# Patient Record
Sex: Female | Born: 1955 | Race: White | Hispanic: No | Smoking: Never smoker
Health system: Southern US, Community
[De-identification: ages and names within clinical notes are randomized; demographics above are authoritative.]

## PROBLEM LIST (undated history)

## (undated) DIAGNOSIS — C4491 Basal cell carcinoma of skin, unspecified: Secondary | ICD-10-CM

## (undated) HISTORY — DX: Basal cell carcinoma of skin, unspecified: C44.91

## (undated) HISTORY — PX: BASAL CELL CARCINOMA EXCISION: SHX1214

## (undated) HISTORY — PX: OTHER SURGICAL HISTORY: SHX169

---

## 1983-08-08 HISTORY — PX: BACK SURGERY: SHX140

## 2000-08-02 ENCOUNTER — Encounter: Payer: Self-pay | Admitting: Family Medicine

## 2000-08-02 ENCOUNTER — Encounter: Admission: RE | Admit: 2000-08-02 | Discharge: 2000-08-02 | Payer: Self-pay | Admitting: Family Medicine

## 2001-02-25 ENCOUNTER — Other Ambulatory Visit: Admission: RE | Admit: 2001-02-25 | Discharge: 2001-02-25 | Payer: Self-pay | Admitting: Family Medicine

## 2003-09-01 ENCOUNTER — Emergency Department (HOSPITAL_COMMUNITY): Admission: EM | Admit: 2003-09-01 | Discharge: 2003-09-01 | Payer: Self-pay | Admitting: Emergency Medicine

## 2004-09-20 ENCOUNTER — Emergency Department (HOSPITAL_COMMUNITY): Admission: EM | Admit: 2004-09-20 | Discharge: 2004-09-20 | Payer: Self-pay | Admitting: Emergency Medicine

## 2005-09-21 ENCOUNTER — Encounter: Admission: RE | Admit: 2005-09-21 | Discharge: 2005-09-21 | Payer: Self-pay | Admitting: Family Medicine

## 2005-12-05 LAB — HM COLONOSCOPY

## 2006-10-02 ENCOUNTER — Encounter: Admission: RE | Admit: 2006-10-02 | Discharge: 2006-10-02 | Payer: Self-pay | Admitting: Family Medicine

## 2007-10-17 ENCOUNTER — Encounter: Admission: RE | Admit: 2007-10-17 | Discharge: 2007-10-17 | Payer: Self-pay | Admitting: Family Medicine

## 2008-10-20 ENCOUNTER — Encounter: Admission: RE | Admit: 2008-10-20 | Discharge: 2008-10-20 | Payer: Self-pay | Admitting: Family Medicine

## 2009-10-05 LAB — HM MAMMOGRAPHY

## 2009-11-01 ENCOUNTER — Encounter: Admission: RE | Admit: 2009-11-01 | Discharge: 2009-11-01 | Payer: Self-pay | Admitting: Family Medicine

## 2010-12-13 ENCOUNTER — Ambulatory Visit: Payer: Self-pay | Admitting: Family Medicine

## 2011-01-06 ENCOUNTER — Other Ambulatory Visit (HOSPITAL_COMMUNITY)
Admission: RE | Admit: 2011-01-06 | Discharge: 2011-01-06 | Disposition: A | Discharge status: Home / Self Care | Payer: PRIVATE HEALTH INSURANCE | Source: Ambulatory Visit | Attending: Family Medicine | Admitting: Family Medicine

## 2011-01-06 ENCOUNTER — Encounter: Payer: Self-pay | Admitting: Family Medicine

## 2011-01-06 ENCOUNTER — Ambulatory Visit (INDEPENDENT_AMBULATORY_CARE_PROVIDER_SITE_OTHER): Payer: PRIVATE HEALTH INSURANCE | Admitting: Family Medicine

## 2011-01-06 DIAGNOSIS — Z13220 Encounter for screening for lipoid disorders: Secondary | ICD-10-CM

## 2011-01-06 DIAGNOSIS — Z1159 Encounter for screening for other viral diseases: Secondary | ICD-10-CM | POA: Insufficient documentation

## 2011-01-06 DIAGNOSIS — Z23 Encounter for immunization: Secondary | ICD-10-CM

## 2011-01-06 DIAGNOSIS — Z8349 Family history of other endocrine, nutritional and metabolic diseases: Secondary | ICD-10-CM

## 2011-01-06 DIAGNOSIS — J309 Allergic rhinitis, unspecified: Secondary | ICD-10-CM | POA: Insufficient documentation

## 2011-01-06 DIAGNOSIS — Z0000 Encounter for general adult medical examination without abnormal findings: Secondary | ICD-10-CM

## 2011-01-06 DIAGNOSIS — Z1231 Encounter for screening mammogram for malignant neoplasm of breast: Secondary | ICD-10-CM

## 2011-01-06 DIAGNOSIS — M810 Age-related osteoporosis without current pathological fracture: Secondary | ICD-10-CM

## 2011-01-06 DIAGNOSIS — Z01419 Encounter for gynecological examination (general) (routine) without abnormal findings: Secondary | ICD-10-CM

## 2011-01-06 NOTE — Progress Notes (Signed)
  Subjective:    Patient ID: Christy Gallegos, female    DOB: 1955-11-07, 55 y.o.   MRN: [id number]  HPI New pt here to establish.  Previously saw Dr. Raquel James. Last saw her over a year ago.  She is overdue for CPX.    Review of Systems  Constitutional: Negative for fever, fatigue and unexpected weight change.  HENT: Negative for congestion and sneezing.   Gastrointestinal: Negative for abdominal pain, diarrhea, constipation, blood in stool and abdominal distention.  Genitourinary: Negative for dysuria, hematuria and enuresis.  Musculoskeletal: Negative for arthralgias and gait problem.  Neurological: Negative for syncope, weakness and numbness.       HAs residual numbness in right 4 and 5th toes from previous back surgery  Psychiatric/Behavioral: Negative for behavioral problems, dysphoric mood and agitation.       Objective:   Physical Exam  Constitutional: Vital signs are normal. She appears well-developed and well-nourished. She is cooperative.  Non-toxic appearance. She does not appear ill. No distress.  HENT:  Head: Normocephalic.  Right Ear: Hearing, tympanic membrane, external ear and ear canal normal.  Left Ear: Hearing, tympanic membrane, external ear and ear canal normal.  Nose: Nose normal.  Eyes: Conjunctivae, EOM and lids are normal. Pupils are equal, round, and reactive to light. No foreign bodies found.  Neck: Trachea normal and normal range of motion. Neck supple. Carotid bruit is not present. No mass and no thyromegaly present.  Cardiovascular: Normal rate, regular rhythm, S1 normal, S2 normal, normal heart sounds and intact distal pulses.  Exam reveals no gallop.   No murmur heard. Pulmonary/Chest: Effort normal and breath sounds normal. No respiratory distress. She has no wheezes. She has no rhonchi. She has no rales.  Abdominal: Soft. Normal appearance and bowel sounds are normal. She exhibits no distension, no fluid wave, no abdominal bruit and no mass. There  is no hepatosplenomegaly. There is no tenderness. There is no rebound, no guarding and no CVA tenderness. No hernia.  Genitourinary: Rectum normal, vagina normal and uterus normal. No breast swelling, tenderness, discharge or bleeding. Pelvic exam was performed with patient prone. There is no rash, tenderness or lesion on the right labia. There is no rash, tenderness or lesion on the left labia. Uterus is not enlarged and not tender. Cervix exhibits no motion tenderness, no discharge and no friability. Right adnexum displays no mass, no tenderness and no fullness. Left adnexum displays no mass, no tenderness and no fullness.  Lymphadenopathy:    She has no cervical adenopathy.    She has no axillary adenopathy.  Neurological: She is alert. She has normal strength. No cranial nerve deficit or sensory deficit.  Skin: Skin is warm, dry and intact. No rash noted.  Psychiatric: Her speech is normal and behavior is normal. Judgment normal. Her mood appears not anxious. Cognition and memory are normal. She does not exhibit a depressed mood.          Assessment & Plan:  Complete pHysical Exam : The patient's preventative maintenance and recommended screening tests for an annual wellness exam were reviewed in full today. Brought up to date unless services declined.  Counselled on the importance of diet, exercise, and its role in overall health and mortality. The patient's FH and SH was reviewed, including their home life, tobacco status, and drug and alcohol status.

## 2011-01-06 NOTE — Assessment & Plan Note (Signed)
On calcium, vit D. Minimal weight bearing exercsie.  Tried fosamax and actonel.. Caused jaw pain and GI distress. Due for reeval.

## 2011-01-06 NOTE — Patient Instructions (Addendum)
[facility] to schedule [facility] when you are ready.  Due for repeat DEXA...schedule at your office and fax results to [place]. Work on increasing exercise and continue healthy diet.

## 2011-01-13 ENCOUNTER — Encounter: Payer: Self-pay | Admitting: *Deleted

## 2011-01-13 LAB — HM DEXA SCAN

## 2011-01-17 ENCOUNTER — Encounter: Payer: Self-pay | Admitting: Family Medicine

## 2011-01-23 ENCOUNTER — Ambulatory Visit
Admission: RE | Admit: 2011-01-23 | Discharge: 2011-01-23 | Disposition: A | Discharge status: Home / Self Care | Payer: PRIVATE HEALTH INSURANCE | Source: Ambulatory Visit | Attending: Family Medicine | Admitting: Family Medicine

## 2011-01-23 DIAGNOSIS — Z1231 Encounter for screening mammogram for malignant neoplasm of breast: Secondary | ICD-10-CM

## 2011-01-25 ENCOUNTER — Encounter: Payer: Self-pay | Admitting: Family Medicine

## 2011-01-25 ENCOUNTER — Encounter: Payer: Self-pay | Admitting: *Deleted

## 2011-01-29 ENCOUNTER — Encounter: Payer: Self-pay | Admitting: Family Medicine

## 2011-02-14 ENCOUNTER — Other Ambulatory Visit: Payer: Self-pay | Admitting: Family Medicine

## 2011-02-14 LAB — LIPID PANEL
Cholesterol: 223 mg/dL — ABNORMAL HIGH (ref 0–200)
Triglycerides: 55 mg/dL (ref <150)

## 2011-02-14 LAB — COMPREHENSIVE METABOLIC PANEL
ALT: 26 U/L (ref 0–35)
AST: 27 U/L (ref 0–37)
BUN: 13 mg/dL (ref 6–23)
Creat: 0.77 mg/dL (ref 0.50–1.10)

## 2011-10-11 ENCOUNTER — Other Ambulatory Visit: Payer: Self-pay | Admitting: Family Medicine

## 2011-10-11 DIAGNOSIS — Z1231 Encounter for screening mammogram for malignant neoplasm of breast: Secondary | ICD-10-CM

## 2011-12-19 ENCOUNTER — Telehealth: Payer: Self-pay

## 2011-12-19 NOTE — Telephone Encounter (Signed)
Prescription to fax in outbox.

## 2011-12-19 NOTE — Telephone Encounter (Signed)
RX FAXED TO FAXED AS REQUESTED

## 2011-12-19 NOTE — Telephone Encounter (Signed)
Pt already scheduled CPX 01/19/12 with Dr Ermalene Searing and pt request faxed lab order prior to CPX to [phone]. Pt can be reached at [phone] ext [phone].

## 2012-01-12 ENCOUNTER — Other Ambulatory Visit: Payer: Self-pay | Admitting: Family Medicine

## 2012-01-12 LAB — LIPID PANEL
Cholesterol: 237 mg/dL — ABNORMAL HIGH (ref 0–200)
HDL: 64 mg/dL (ref >39)
Total CHOL/HDL Ratio: 3.7 Ratio
VLDL: 11 mg/dL (ref 0–40)

## 2012-01-12 LAB — COMPREHENSIVE METABOLIC PANEL
AST: 26 U/L (ref 0–37)
Albumin: 4.5 g/dL (ref 3.5–5.2)
CO2: 29 mEq/L (ref 19–32)
Calcium: 9.8 mg/dL (ref 8.4–10.5)
Chloride: 103 mEq/L (ref 96–112)
Creat: 0.76 mg/dL (ref 0.50–1.10)
Potassium: 4.7 mEq/L (ref 3.5–5.3)
Sodium: 141 mEq/L (ref 135–145)
Total Bilirubin: 0.7 mg/dL (ref 0.3–1.2)
Total Protein: 6.9 g/dL (ref 6.0–8.3)

## 2012-01-15 ENCOUNTER — Encounter: Payer: PRIVATE HEALTH INSURANCE | Admitting: Family Medicine

## 2012-01-18 ENCOUNTER — Encounter: Payer: PRIVATE HEALTH INSURANCE | Admitting: Family Medicine

## 2012-01-19 ENCOUNTER — Encounter: Payer: Self-pay | Admitting: Family Medicine

## 2012-01-19 ENCOUNTER — Ambulatory Visit (INDEPENDENT_AMBULATORY_CARE_PROVIDER_SITE_OTHER): Payer: PRIVATE HEALTH INSURANCE | Admitting: Family Medicine

## 2012-01-19 VITALS — BP 100/70 | HR 81 | Temp 98.5°F | Ht 61.5 in | Wt 118.5 lb

## 2012-01-19 DIAGNOSIS — M810 Age-related osteoporosis without current pathological fracture: Secondary | ICD-10-CM

## 2012-01-19 DIAGNOSIS — Z01419 Encounter for gynecological examination (general) (routine) without abnormal findings: Secondary | ICD-10-CM

## 2012-01-19 DIAGNOSIS — E7800 Pure hypercholesterolemia, unspecified: Secondary | ICD-10-CM | POA: Insufficient documentation

## 2012-01-19 DIAGNOSIS — Z0000 Encounter for general adult medical examination without abnormal findings: Secondary | ICD-10-CM

## 2012-01-19 NOTE — Progress Notes (Signed)
Subjective:    Patient ID: Christy Gallegos, female    DOB: [birth year], 56 y.o.   MRN: [id number]  HPI The patient is here for annual wellness exam and preventative care.    Doing well overall. No acute concerns.  Walks daily. Good diet.  Reviewed labs in detail.    Review of Systems  Constitutional: Negative for fever, fatigue and unexpected weight change.  HENT: Negative for ear pain, congestion, sore throat, sneezing, trouble swallowing and sinus pressure.   Eyes: Negative for pain and itching.  Respiratory: Negative for cough, shortness of breath and wheezing.   Cardiovascular: Negative for chest pain, palpitations and leg swelling.  Gastrointestinal: Negative for nausea, abdominal pain, diarrhea, constipation and blood in stool.  Genitourinary: Negative for dysuria, hematuria, vaginal discharge, difficulty urinating and menstrual problem.  Musculoskeletal: Positive for back pain.  Skin: Negative for rash.  Neurological: Negative for syncope, weakness, light-headedness, numbness and headaches.  Psychiatric/Behavioral: Negative for confusion and dysphoric mood. The patient is not nervous/anxious.        Objective:   Physical Exam  Constitutional: Vital signs are normal. She appears well-developed and well-nourished. She is cooperative.  Non-toxic appearance. She does not appear ill. No distress.  HENT:  Head: Normocephalic.  Right Ear: Hearing, tympanic membrane, external ear and ear canal normal.  Left Ear: Hearing, tympanic membrane, external ear and ear canal normal.  Nose: Nose normal.  Eyes: Conjunctivae, EOM and lids are normal. Pupils are equal, round, and reactive to light. No foreign bodies found.  Neck: Trachea normal and normal range of motion. Neck supple. Carotid bruit is not present. No mass and no thyromegaly present.  Cardiovascular: Normal rate, regular rhythm, S1 normal, S2 normal, normal heart sounds and intact distal pulses.  Exam reveals no gallop.   No  murmur heard. Pulmonary/Chest: Effort normal and breath sounds normal. No respiratory distress. She has no wheezes. She has no rhonchi. She has no rales.  Abdominal: Soft. Normal appearance and bowel sounds are normal. She exhibits no distension, no fluid wave, no abdominal bruit and no mass. There is no hepatosplenomegaly. There is no tenderness. There is no rebound, no guarding and no CVA tenderness. No hernia.  Genitourinary: Vagina normal and uterus normal. No breast swelling, tenderness, discharge or bleeding. Pelvic exam was performed with patient prone. There is no rash, tenderness or lesion on the right labia. There is no rash, tenderness or lesion on the left labia. Uterus is not enlarged and not tender. Cervix exhibits no motion tenderness, no discharge and no friability. Right adnexum displays no mass, no tenderness and no fullness. Left adnexum displays no mass, no tenderness and no fullness.  Lymphadenopathy:    She has no cervical adenopathy.    She has no axillary adenopathy.  Neurological: She is alert. She has normal strength. No cranial nerve deficit or sensory deficit.  Skin: Skin is warm, dry and intact. No rash noted.       0.2 cm lesion on right nostril, pearly raised, possibly getting bigger per pt... Recommended following and if increasing in size see Derm for biopsy  Psychiatric: Her speech is normal and behavior is normal. Judgment normal. Her mood appears not anxious. Cognition and memory are normal. She does not exhibit a depressed mood.          Assessment & Plan:  The patient's preventative maintenance and recommended screening tests for an annual wellness exam were reviewed in full today. Brought up to date unless services declined.  Counselled on the importance of diet, exercise, and its role in overall health and mortality. The patient's FH and SH was reviewed, including their home life, tobacco status, and drug and alcohol status.   DEXa: last slightly worse  osteopenia in 2012, next due 2014. Vaccines: Uptodate with Tdap.  Mammo: last nml 2012, has this scheduled. Asymptomatic.  DVE/pap: DVE yearly,  last pap done on 01/2011, on q3 year schedule, next due 2015. Colon: last done 12/05/2005, nml, repeat in 10 years 2017 Non Smoker

## 2012-01-19 NOTE — Assessment & Plan Note (Signed)
Not due for dexa this year.

## 2012-01-19 NOTE — Assessment & Plan Note (Signed)
Counsled on diet and lifestyle change. Will work on this ans recheck in 1 year.  Info sheet given.

## 2012-01-19 NOTE — Patient Instructions (Addendum)
Work on low cholesterol diet. Avoid eating out. Increase exercise. Review cholesterol information packet. Follow area on nose.. If increasing in size.. Call for Dermatology referral.

## 2012-01-29 ENCOUNTER — Ambulatory Visit
Admission: RE | Admit: 2012-01-29 | Discharge: 2012-01-29 | Disposition: A | Discharge status: Home / Self Care | Payer: PRIVATE HEALTH INSURANCE | Source: Ambulatory Visit | Attending: Family Medicine | Admitting: Family Medicine

## 2012-01-29 DIAGNOSIS — Z1231 Encounter for screening mammogram for malignant neoplasm of breast: Secondary | ICD-10-CM

## 2012-12-23 ENCOUNTER — Other Ambulatory Visit: Payer: Self-pay

## 2012-12-23 DIAGNOSIS — Z1231 Encounter for screening mammogram for malignant neoplasm of breast: Secondary | ICD-10-CM

## 2013-01-06 ENCOUNTER — Telehealth: Payer: Self-pay | Admitting: *Deleted

## 2013-01-06 NOTE — Telephone Encounter (Signed)
Patient would like lab order faxed to her work to have lab work done.   [phone](AVW)

## 2013-01-07 NOTE — Telephone Encounter (Signed)
Lab order for what... Upcoming CPX?

## 2013-01-07 NOTE — Telephone Encounter (Signed)
Yes cpx on June 17

## 2013-01-07 NOTE — Telephone Encounter (Signed)
Faxed to patient

## 2013-01-07 NOTE — Telephone Encounter (Signed)
In outbox

## 2013-01-21 ENCOUNTER — Encounter: Payer: PRIVATE HEALTH INSURANCE | Admitting: Family Medicine

## 2013-02-03 ENCOUNTER — Ambulatory Visit
Admission: RE | Admit: 2013-02-03 | Discharge: 2013-02-03 | Disposition: A | Discharge status: Home / Self Care | Payer: PRIVATE HEALTH INSURANCE | Source: Ambulatory Visit

## 2013-02-03 DIAGNOSIS — Z1231 Encounter for screening mammogram for malignant neoplasm of breast: Secondary | ICD-10-CM

## 2013-02-21 ENCOUNTER — Encounter: Payer: PRIVATE HEALTH INSURANCE | Admitting: Family Medicine

## 2013-03-04 ENCOUNTER — Encounter: Payer: PRIVATE HEALTH INSURANCE | Admitting: Family Medicine

## 2013-04-01 ENCOUNTER — Encounter: Payer: PRIVATE HEALTH INSURANCE | Admitting: Family Medicine

## 2013-09-18 ENCOUNTER — Other Ambulatory Visit: Payer: Self-pay | Admitting: Dermatology

## 2015-01-11 ENCOUNTER — Other Ambulatory Visit: Payer: Self-pay

## 2015-01-11 DIAGNOSIS — Z1231 Encounter for screening mammogram for malignant neoplasm of breast: Secondary | ICD-10-CM

## 2015-01-25 ENCOUNTER — Ambulatory Visit
Admission: RE | Admit: 2015-01-25 | Discharge: 2015-01-25 | Disposition: A | Discharge status: Home / Self Care | Payer: PRIVATE HEALTH INSURANCE | Source: Ambulatory Visit

## 2015-01-25 DIAGNOSIS — Z1231 Encounter for screening mammogram for malignant neoplasm of breast: Secondary | ICD-10-CM

## 2015-01-26 ENCOUNTER — Encounter: Payer: Self-pay | Admitting: Family Medicine

## 2015-01-26 LAB — HM MAMMOGRAPHY: HM Mammogram: NORMAL

## 2015-07-25 ENCOUNTER — Ambulatory Visit (INDEPENDENT_AMBULATORY_CARE_PROVIDER_SITE_OTHER): Payer: PRIVATE HEALTH INSURANCE | Admitting: Family Medicine

## 2015-07-25 VITALS — BP 118/72 | HR 90 | Temp 98.1°F | Resp 16 | Ht 61.5 in | Wt 124.0 lb

## 2015-07-25 DIAGNOSIS — H5711 Ocular pain, right eye: Secondary | ICD-10-CM

## 2015-07-25 DIAGNOSIS — H539 Unspecified visual disturbance: Secondary | ICD-10-CM | POA: Diagnosis not present

## 2015-07-25 NOTE — Progress Notes (Signed)
[facility] [address] [place] [address] [phone]  Date:  07/25/2015   Name:  Christy Gallegos   DOB:  12-Jan-1956   MRN:  [id number]  PCP:  Eliezer Lofts, MD    Chief Complaint: Eye Pain   History of Present Illness:  This is a 59 y.o. female  who is presenting right right eye redness and watery drainage x 3 days.  Very blurred vision out of that eye. + Photophobia. Sharp shooting pain in eyeball with EOM. No pain with blinking. Pain down into maxillary area and having right sided headache that is worse with bending forward. Headache started after eye symptoms started. Feeling congested right nostril. Taking tylenol sinus but not helping Has had 4 episodes in the past year of similar symptoms. Usually in right eye but has also been in left eye. Sees optometrist Macarthur Critchley who has told her it might be severe dry eye that is causing corneal abrasions. She does state one time she was given oral abx which helped. Current symptoms are worse than prior Denies fever or chills, sore throat, otalgia.  No hx autoimmune diseases. PCP: Eliezer Lofts  Review of Systems:  Review of Systems See HPI  Patient Active Problem List   Diagnosis Date Noted  . High cholesterol 01/19/2012  . Allergic rhinitis 01/06/2011  . Osteoporosis 01/06/2011    Prior to Admission medications   Medication Sig Start Date End Date Taking? Authorizing Provider  Black Cohosh (REMIFEMIN PO) Take by mouth.     Yes Historical Provider, MD  Calcium Carbonate-Vitamin D (CALTRATE 600+D) 600-400 MG-UNIT per tablet Take 1 tablet by mouth daily.     Yes Historical Provider, MD  fish oil-omega-3 fatty acids 1000 MG capsule Take 1 g by mouth 2 (two) times daily.     Yes Historical Provider, MD  L-Lysine 500 MG CAPS Take 1 capsule by mouth daily.     Yes Historical Provider, MD  loratadine (CLARITIN) 10 MG tablet Take 10 mg by mouth daily.     Yes Historical Provider, MD  Multiple  Vitamins-Minerals (CENTRUM SILVER) tablet Take 1 tablet by mouth daily.     Yes Historical Provider, MD  vitamin C (ASCORBIC ACID) 500 MG tablet Take 500 mg by mouth daily.     Yes Historical Provider, MD  vitamin E 400 UNIT capsule Take 400 Units by mouth daily.     Yes Historical Provider, MD    No Known Allergies  Past Surgical History  Procedure Laterality Date  . Back surgery  1985    L5-S1   . [place]sv[place]  [id number]    twins    Social History  Substance Use Topics  . Smoking status: Never Smoker   . Smokeless tobacco: Never Used  . Alcohol Use: No    Family History  Problem Relation Age of Onset  . Diabetes Mother   . Cancer Mother 75    bladder  . Arthritis Mother   . Neuropathy Mother   . Osteoporosis Mother   . Hypothyroidism Mother   . Osteoporosis Father   . Hyperlipidemia Father   . Stroke Maternal Grandmother   . Stroke Maternal Grandfather 60  . Heart disease Paternal Grandfather   . Arthritis Paternal Grandfather     Medication list has been reviewed and updated.  Physical Examination:  Physical Exam  Constitutional: She is oriented to person, place, and time. She appears well-developed and well-nourished. No distress.  HENT:  Head: Normocephalic  and atraumatic.  Right Ear: Hearing, tympanic membrane, external ear and ear canal normal.  Left Ear: Hearing, tympanic membrane, external ear and ear canal normal.  Nose: Right sinus exhibits maxillary sinus tenderness and frontal sinus tenderness. Left sinus exhibits no maxillary sinus tenderness and no frontal sinus tenderness.  Mouth/Throat: Uvula is midline, oropharynx is clear and moist and mucous membranes are normal.  Eyes: Pupils are equal, round, and reactive to light. Right eye exhibits discharge (watery). Left eye exhibits no discharge. Right conjunctiva is injected (mild). No scleral icterus.  Pain with EOM on right eye Cloudy circular area inferior to right pupil visible with naked eye. Same area with  uptake on woods lamp. No pyema seen Globe pain present  Cardiovascular: Normal rate, regular rhythm, normal heart sounds and normal pulses.   No murmur heard. Pulmonary/Chest: Effort normal and breath sounds normal. No respiratory distress. She has no wheezes. She has no rhonchi. She has no rales.  Musculoskeletal: Normal range of motion.  Lymphadenopathy:       Head (right side): No submental, no submandibular and no tonsillar adenopathy present.       Head (left side): No submental, no submandibular and no tonsillar adenopathy present.    She has no cervical adenopathy.  Neurological: She is alert and oriented to person, place, and time.  Skin: Skin is warm, dry and intact. No lesion and no rash noted.  Psychiatric: She has a normal mood and affect. Her speech is normal and behavior is normal. Thought content normal.   BP 118/72 mmHg  Pulse 90  Temp(Src) 98.1 F (36.7 C) (Oral)  Resp 16  Ht 5' 1.5" (1.562 m)  Wt 124 lb (56.246 kg)  BMI 23.05 kg/m2  SpO2 98%   Visual Acuity Screening   Right eye Left eye Both eyes  Without correction:  20/20 20/20  With correction:     Comments: unable to see out of right eye.  Assessment and Plan:  1. Eye pain, right 2. Vision changes Globe pain, severe vision change, pain with EOM, cloudy lesion inferior to right pupil -- all concerning for large corneal abrasion vs ulcer. Spoke with Dr. Wyatt Portela on the phone who agreed to see pt in his office today. Concern for possible autoimmune disease contributing to recurrent eye infections.   Benjaman Pott Drenda Freeze, MHS [facility]  07/25/2015

## 2015-07-25 NOTE — Patient Instructions (Signed)
Dr. Wyatt Portela [phone]

## 2015-07-25 NOTE — Progress Notes (Signed)
History and physical examinations obtained with PA Bush. Agree with assessment and plan as outlined.  Urgent referral to ophthalmology. Manuella Blackson Elayne Guerin, M.D. [facility] [address] [place], [place]  [address] [phone] phone [phone] fax

## 2015-12-22 ENCOUNTER — Other Ambulatory Visit: Payer: Self-pay

## 2015-12-22 DIAGNOSIS — Z1231 Encounter for screening mammogram for malignant neoplasm of breast: Secondary | ICD-10-CM

## 2016-02-14 ENCOUNTER — Ambulatory Visit: Payer: PRIVATE HEALTH INSURANCE

## 2016-02-28 ENCOUNTER — Ambulatory Visit
Admission: RE | Admit: 2016-02-28 | Discharge: 2016-02-28 | Disposition: A | Discharge status: Home / Self Care | Payer: PRIVATE HEALTH INSURANCE | Source: Ambulatory Visit

## 2016-02-28 DIAGNOSIS — Z1231 Encounter for screening mammogram for malignant neoplasm of breast: Secondary | ICD-10-CM

## 2017-01-27 ENCOUNTER — Ambulatory Visit (HOSPITAL_COMMUNITY)
Admission: EM | Admit: 2017-01-27 | Discharge: 2017-01-27 | Disposition: A | Discharge status: Home / Self Care | Payer: PRIVATE HEALTH INSURANCE

## 2017-01-27 ENCOUNTER — Encounter (HOSPITAL_COMMUNITY): Payer: Self-pay | Admitting: Emergency Medicine

## 2017-01-27 DIAGNOSIS — H0011 Chalazion right upper eyelid: Secondary | ICD-10-CM

## 2017-01-27 NOTE — ED Provider Notes (Signed)
CSN: [id number]     Arrival date & time 01/27/17  1453 History   First MD Initiated Contact with Patient 01/27/17 1553     Chief Complaint  Patient presents with  . Stye   (Consider location/radiation/quality/duration/timing/severity/associated sxs/prior Treatment) Patient came in today complaining of a bump to her right upper eyelid x 3 days, getting bigger and large. The area is slightly tender to palpate but no pain. She also denies visual disturbances, no discharge, no eye pain, no eye redness. Some itchiness is present. She have been self-treating with warm compress for the past 3 days with no improvement. She has an ophthalmologist. She have had a situation in the past where her "duct clogged up" and her ophthalmologist had to surgically removed it.        History reviewed. No pertinent past medical history. Past Surgical History:  Procedure Laterality Date  . [place]   L5-S1   . NSVD  [id number]   twins   Family History  Problem Relation Age of Onset  . Diabetes Mother   . Cancer Mother 3       bladder  . Arthritis Mother   . Neuropathy Mother   . Osteoporosis Mother   . Hypothyroidism Mother   . Osteoporosis Father   . Hyperlipidemia Father   . Stroke Maternal Grandmother   . Stroke Maternal Grandfather 60  . Heart disease Paternal Grandfather   . Arthritis Paternal Grandfather    Social History  Substance Use Topics  . Smoking status: Never Smoker  . Smokeless tobacco: Never Used  . Alcohol use No   OB History    No data available     Review of Systems  Constitutional:       See HPI    Allergies  Patient has no known allergies.  Home Medications   Prior to Admission medications   Medication Sig Start Date End Date Taking? Authorizing Provider  Black Cohosh (REMIFEMIN PO) Take by mouth.      [provider]  Calcium Carbonate-Vitamin D (CALTRATE 600+D) 600-400 MG-UNIT per tablet Take 1 tablet by mouth daily.      [provider]  fish oil-omega-3 fatty acids 1000 MG capsule Take 1 g by mouth 2 (two) times daily.      [provider]  L-Lysine 500 MG CAPS Take 1 capsule by mouth daily.      [provider]  loratadine (CLARITIN) 10 MG tablet Take 10 mg by mouth daily.      [provider]  Multiple Vitamins-Minerals (CENTRUM SILVER) tablet Take 1 tablet by mouth daily.      [provider]  vitamin C (ASCORBIC ACID) 500 MG tablet Take 500 mg by mouth daily.      [provider]  vitamin E 400 UNIT capsule Take 400 Units by mouth daily.      [provider]   Meds Ordered and Administered this Visit  Medications - No data to display  BP 130/76 (BP Location: Right Arm)   Pulse 75   Temp 98.2 F (36.8 C) (Oral)   Resp 16   Ht 5\' 1"  (1.549 m)   Wt 120 lb (54.4 kg)   SpO2 100%   BMI 22.67 kg/m  No data found.   Physical Exam  Constitutional: She is oriented to person, place, and time. She appears well-developed and well-nourished.  HENT:  Head: Normocephalic and atraumatic.  Right Ear: External ear normal.  Left Ear:  External ear normal.  Nose: Nose normal.  Mouth/Throat: Oropharynx is clear and moist. No oropharyngeal exudate.  Eyes: Conjunctivae, EOM and lids are normal. Pupils are equal, round, and reactive to light. Right eye exhibits no discharge and no exudate. No foreign body present in the right eye. Left eye exhibits no discharge and no exudate. No foreign body present in the left eye. Right conjunctiva is not injected. Left conjunctiva is not injected.    Has erythematous nontender rubbery nodule of 6 mm on right upper eyelid.    Neck: Normal range of motion.  Cardiovascular: Normal rate, regular rhythm and normal heart sounds.   Pulmonary/Chest: Effort normal and breath sounds normal. She has no wheezes.  Neurological: She is alert and oriented to person, place, and time.  Skin: Skin is warm and dry.  Nursing note and vitals  reviewed.   Urgent [facility] Course     Procedures (including critical care time)  Labs Review Labs Reviewed - No data to display  Imaging Review No results found.  MDM   1. Chalazion of right upper eyelid    Clinical presentation consistent with Chalazion. Education provided. Educated that antibiotic is not effective.  Continue with warm compress several times a day at home, 15 min each time; be more consistent with the warm compress. Hopefully will resolve spontaneously with the warm compress, if not then advised to see her ophthalmologist for further treatments.       Barry Dienes, NP 01/27/17 [phone]

## 2017-01-27 NOTE — ED Triage Notes (Signed)
Pt. Stated, I have a bump on rt. Eye. It looks like a stye. Started on Wed. Pt has put warm on it not moist.

## 2019-06-19 ENCOUNTER — Other Ambulatory Visit: Payer: Self-pay | Admitting: Family Medicine

## 2019-06-19 ENCOUNTER — Telehealth: Payer: Self-pay | Admitting: Family Medicine

## 2019-06-19 DIAGNOSIS — Z1231 Encounter for screening mammogram for malignant neoplasm of breast: Secondary | ICD-10-CM

## 2019-06-19 NOTE — Telephone Encounter (Signed)
Patient was previously seen by you back in 2013. She called today to schedule an complete physical. Advised patient that after 3 years we have to re establish her with the office.  Patient would really like to see you again since she has seen you in years past.   Can this patient Re Establish with you or will she need to start care with another provider?

## 2019-06-20 ENCOUNTER — Telehealth: Payer: Self-pay

## 2019-06-20 NOTE — Telephone Encounter (Signed)
Patient has been scheduled for a establish care visit on 1/5 - and states that she has no issues going on right now - and would like a physical.  Patient states that she works at [employer], and it is very hard for her to get off work. She would like to know if Dr. Diona Browner would like to order labs, and if so, could she fax lab orders to their office for her to have her labs drawn there?  Please advise. Thank you!

## 2019-06-20 NOTE — Telephone Encounter (Signed)
Error

## 2019-06-20 NOTE — Telephone Encounter (Signed)
Left message for patient to call back to schedule appointment  See Dr Rometta Emery message below

## 2019-06-20 NOTE — Telephone Encounter (Signed)
Let pt know that is fine.  There is a prescription in Christy Gallegos's basket in my office.

## 2019-06-20 NOTE — Telephone Encounter (Signed)
She can re-establish with me..  Let her know  Will need appt to reestablish and then likely another later for CPX, unless she has minimal other issues. Go ahead and malke both.. okay to work in in next  Month.

## 2019-06-23 NOTE — Telephone Encounter (Signed)
Lab orders faxed to Wynetta Emery at [phone].

## 2019-07-09 ENCOUNTER — Telehealth: Payer: Self-pay | Admitting: Family Medicine

## 2019-07-09 NOTE — Telephone Encounter (Signed)
Pt has cpx scheduled 08/12/2019.  She would like a lab ordered mailed to her so she can get her labs done at work

## 2019-07-10 NOTE — Telephone Encounter (Signed)
Rx for labs in Donna's outbox

## 2019-07-10 NOTE — Telephone Encounter (Signed)
Lab orders mailed to home address as requested.

## 2019-08-12 ENCOUNTER — Ambulatory Visit: Payer: PRIVATE HEALTH INSURANCE | Admitting: Family Medicine

## 2019-08-13 ENCOUNTER — Ambulatory Visit: Payer: PRIVATE HEALTH INSURANCE

## 2019-08-14 ENCOUNTER — Ambulatory Visit: Payer: PRIVATE HEALTH INSURANCE

## 2019-09-16 ENCOUNTER — Ambulatory Visit: Payer: PRIVATE HEALTH INSURANCE | Attending: Internal Medicine

## 2019-09-18 ENCOUNTER — Ambulatory Visit: Payer: PRIVATE HEALTH INSURANCE

## 2019-10-03 ENCOUNTER — Ambulatory Visit (INDEPENDENT_AMBULATORY_CARE_PROVIDER_SITE_OTHER): Payer: PRIVATE HEALTH INSURANCE | Admitting: Family Medicine

## 2019-10-03 ENCOUNTER — Other Ambulatory Visit (HOSPITAL_COMMUNITY)
Admission: RE | Admit: 2019-10-03 | Discharge: 2019-10-03 | Disposition: A | Discharge status: Home / Self Care | Payer: PRIVATE HEALTH INSURANCE | Source: Ambulatory Visit | Attending: Family Medicine | Admitting: Family Medicine

## 2019-10-03 ENCOUNTER — Encounter: Payer: Self-pay | Admitting: Family Medicine

## 2019-10-03 ENCOUNTER — Other Ambulatory Visit: Payer: Self-pay

## 2019-10-03 VITALS — BP 120/80 | HR 79 | Temp 98.2°F | Ht 61.5 in | Wt 111.5 lb

## 2019-10-03 DIAGNOSIS — E7800 Pure hypercholesterolemia, unspecified: Secondary | ICD-10-CM | POA: Diagnosis not present

## 2019-10-03 DIAGNOSIS — Z124 Encounter for screening for malignant neoplasm of cervix: Secondary | ICD-10-CM | POA: Diagnosis not present

## 2019-10-03 DIAGNOSIS — Z0000 Encounter for general adult medical examination without abnormal findings: Secondary | ICD-10-CM

## 2019-10-03 DIAGNOSIS — H18509 Unspecified hereditary corneal dystrophies, unspecified eye: Secondary | ICD-10-CM

## 2019-10-03 DIAGNOSIS — Z1211 Encounter for screening for malignant neoplasm of colon: Secondary | ICD-10-CM

## 2019-10-03 DIAGNOSIS — M810 Age-related osteoporosis without current pathological fracture: Secondary | ICD-10-CM | POA: Diagnosis not present

## 2019-10-03 DIAGNOSIS — Z1231 Encounter for screening mammogram for malignant neoplasm of breast: Secondary | ICD-10-CM

## 2019-10-03 NOTE — Patient Instructions (Addendum)
Set up bone density and mammogram. Work on healthy eating and regular exercise. Return stool cards.

## 2019-10-03 NOTE — Progress Notes (Signed)
Chief Complaint  Patient presents with  . Re-establish Care    History of Present Illness: HPI   64 year old female presents for  Re-establishement of care and physical.  Last OV here was  For [facility] in 2011-11-11.  She has not been to PCP since.Marland Kitchen No CPX since.   IN meantime: Corneal laceration in 11-Nov-2014.. saw Dr. Katy Fitch and multiple eye MDs. 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    Father passed away  in last month for B cell lymphoma/leukemia  Husband  passed away in 2016-11-10.  Dealing with losses fairly well.  Denied depression.   Elevated Cholesterol:  Lab eval completed at work  [facility]:  total 245, HDl 91  CVD 10 year risk 3/5 % Using medications without problems: Muscle aches:  good Exercise: walking Other complaints:   Wt Readings from Last 3 Encounters:  10/03/19 111 lb 8 oz (50.6 kg)  01/27/17 120 lb (54.4 kg)  07/25/15 124 lb (56.2 kg)     Osteoporosis: Last check DEX 2010-11-11 osteopenia.. not on medication.  SE to bisphosphonate   Allergies: stable  This visit occurred during the SARS-CoV-2 public health emergency.  Safety protocols were in place, including screening questions prior to the visit, additional usage of staff PPE, and extensive cleaning of exam room while observing appropriate contact time as indicated for disinfecting solutions.   COVID 19 screen:  No recent travel or known exposure to COVID19 The patient denies respiratory symptoms of COVID 19 at this time. The importance of social distancing was discussed today.     Review of Systems  Constitutional: Negative for chills and fever.  HENT: Negative for congestion and ear pain.   Eyes: Negative for pain and redness.  Respiratory: Negative for cough and shortness of breath.   Cardiovascular: Negative for chest pain, palpitations and leg swelling.  Gastrointestinal: Negative for abdominal pain, blood in stool, constipation, diarrhea, nausea and vomiting.  Genitourinary: Negative  for dysuria.  Musculoskeletal: Negative for falls and myalgias.  Skin: Negative for rash.  Neurological: Negative for dizziness.  Psychiatric/Behavioral: Negative for depression. The patient is not nervous/anxious.       History reviewed. No pertinent past medical history.  reports that she has never smoked. She has never used smokeless tobacco. She reports that she does not drink alcohol or use drugs.   Current Outpatient Medications:  .  Calcium Carbonate-Vitamin D (CALTRATE 600+D) 600-400 MG-UNIT per tablet, Take 1 tablet by mouth daily.  , Disp: , Rfl:  .  Cholecalciferol (VITAMIN D3) 50 MCG (2000 UT) CHEW, Chew 1 tablet by mouth daily., Disp: , Rfl:  .  fish oil-omega-3 fatty acids 1000 MG capsule, Take 1 g by mouth 2 (two) times daily.  , Disp: , Rfl:  .  L-Lysine 500 MG CAPS, Take 1 capsule by mouth daily.  , Disp: , Rfl:  .  loratadine (CLARITIN) 10 MG tablet, Take 10 mg by mouth daily.  , Disp: , Rfl:  .  Multiple Vitamins-Minerals (CENTRUM SILVER) tablet, Take 1 tablet by mouth daily.  , Disp: , Rfl:  .  vitamin C (ASCORBIC ACID) 500 MG tablet, Take 500 mg by mouth daily.  , Disp: , Rfl:  .  vitamin E 400 UNIT capsule, Take 400 Units by mouth daily.  , Disp: , Rfl:    Observations/Objective: Blood pressure 120/80, pulse 79, temperature 98.2 F (36.8 C), temperature source Temporal, height 5' 1.5" (1.562 m), weight 111 lb 8 oz (50.6 kg),  SpO2 98 %.  Physical Exam Exam conducted with a chaperone present.  Constitutional:      General: She is not in acute distress.    Appearance: Normal appearance. She is well-developed. She is not ill-appearing or toxic-appearing.  HENT:     Head: Normocephalic.     Right Ear: Hearing, tympanic membrane, ear canal and external ear normal.     Left Ear: Hearing, tympanic membrane, ear canal and external ear normal.     Nose: Nose normal.  Eyes:     General: Lids are normal. Lids are everted, no foreign bodies appreciated.      Conjunctiva/sclera: Conjunctivae normal.     Pupils: Pupils are equal, round, and reactive to light.  Neck:     Thyroid: No thyroid mass or thyromegaly.     Vascular: No carotid bruit.     Trachea: Trachea normal.  Cardiovascular:     Rate and Rhythm: Normal rate and regular rhythm.     Heart sounds: Normal heart sounds, S1 normal and S2 normal. No murmur. No gallop.   Pulmonary:     Effort: Pulmonary effort is normal. No respiratory distress.     Breath sounds: Normal breath sounds. No wheezing, rhonchi or rales.  Abdominal:     General: Bowel sounds are normal. There is no distension or abdominal bruit.     Palpations: Abdomen is soft. There is no fluid wave or mass.     Tenderness: There is no abdominal tenderness. There is no guarding or rebound.     Hernia: No hernia is present. There is no hernia in the left inguinal area or right inguinal area.  Genitourinary:    Exam position: Supine.     Pubic Area: No rash.      Labia:        Right: No rash, tenderness or lesion.        Left: No rash, tenderness or lesion.      Vagina: Normal.     Cervix: Normal.     Uterus: Normal.      Adnexa: Right adnexa normal and left adnexa normal.     Rectum: Normal.  Musculoskeletal:     Cervical back: Normal range of motion and neck supple.  Lymphadenopathy:     Cervical: No cervical adenopathy.     Lower Body: No right inguinal adenopathy. No left inguinal adenopathy.  Skin:    General: Skin is warm and dry.     Findings: No rash.  Neurological:     Mental Status: She is alert.     Cranial Nerves: No cranial nerve deficit.     Sensory: No sensory deficit.  Psychiatric:        Mood and Affect: Mood is not anxious or depressed.        Speech: Speech normal.        Behavior: Behavior normal. Behavior is cooperative.        Judgment: Judgment normal.      Assessment and Plan The patient's preventative maintenance and recommended screening tests for an annual wellness exam were  reviewed in full today. Brought up to date unless services declined.  Counselled on the importance of diet, exercise, and its role in overall health and mortality. The patient's FH and SH was reviewed, including their home life, tobacco status, and drug and alcohol status.   Vaccines: Uptodate with td, COVID19 vaccine Pap/DVE:  Due for re-eval Mammo:  due Bone Density: due Colon:  2007 normal, plan ifob yearly  Smoking Status: none ETOH/ drug use: none/none  Hep C:   Plan in future  HIV screen:     High cholesterol Due for re-eval. Encouraged exercise, weight maintanence, healthy eating habits.   Osteoporosis Due for re-eval. Recommend weight bearing exercise, calcium in diet and vit D supplement 400 IU 1-2 times daily.    Allergic rhinitis Stable control on current regimen.       Eliezer Lofts, MD

## 2019-10-07 ENCOUNTER — Encounter: Payer: Self-pay | Admitting: *Deleted

## 2019-10-07 LAB — CYTOLOGY - PAP
Comment: NEGATIVE
Diagnosis: NEGATIVE
High risk HPV: NEGATIVE

## 2019-10-08 NOTE — Assessment & Plan Note (Signed)
Stable control on current regimen. 

## 2019-10-08 NOTE — Assessment & Plan Note (Signed)
Due for re-eval. Encouraged exercise, weight maintanence, healthy eating habits.

## 2019-10-08 NOTE — Assessment & Plan Note (Signed)
Due for re-eval. Recommend weight bearing exercise, calcium in diet and vit D supplement 400 IU 1-2 times daily.

## 2019-11-14 ENCOUNTER — Ambulatory Visit: Payer: PRIVATE HEALTH INSURANCE

## 2019-11-14 ENCOUNTER — Other Ambulatory Visit: Payer: Self-pay

## 2019-11-14 ENCOUNTER — Ambulatory Visit
Admission: RE | Admit: 2019-11-14 | Discharge: 2019-11-14 | Disposition: A | Discharge status: Home / Self Care | Payer: PRIVATE HEALTH INSURANCE | Source: Ambulatory Visit | Attending: Family Medicine | Admitting: Family Medicine

## 2019-11-14 DIAGNOSIS — Z1231 Encounter for screening mammogram for malignant neoplasm of breast: Secondary | ICD-10-CM

## 2019-12-08 ENCOUNTER — Other Ambulatory Visit: Payer: PRIVATE HEALTH INSURANCE

## 2019-12-16 ENCOUNTER — Other Ambulatory Visit: Payer: PRIVATE HEALTH INSURANCE

## 2020-02-12 ENCOUNTER — Other Ambulatory Visit: Payer: PRIVATE HEALTH INSURANCE

## 2020-03-18 ENCOUNTER — Ambulatory Visit
Admission: RE | Admit: 2020-03-18 | Discharge: 2020-03-18 | Disposition: A | Discharge status: Home / Self Care | Payer: PRIVATE HEALTH INSURANCE | Source: Ambulatory Visit | Attending: Family Medicine | Admitting: Family Medicine

## 2020-03-18 ENCOUNTER — Other Ambulatory Visit: Payer: Self-pay

## 2020-03-18 DIAGNOSIS — M810 Age-related osteoporosis without current pathological fracture: Secondary | ICD-10-CM

## 2020-03-23 ENCOUNTER — Telehealth: Payer: Self-pay | Admitting: Family Medicine

## 2020-03-23 NOTE — Telephone Encounter (Signed)
Pt wants to know  How severe is osteoporosis what does dr Diona Browner recommend   Does dr Diona Browner recommend medication over diet do you have information on this   If so please mail to pt.   Please advise

## 2020-03-25 NOTE — Telephone Encounter (Signed)
Left message for Christy Gallegos to return my call.  Per Dr. Diona Browner:  Patient's Tscore is -2.9 (mild to moderate osteoporosis.  Dr. Diona Browner recommends office visit to discuss medication options.  Hand out mailed to patient.

## 2020-03-25 NOTE — Telephone Encounter (Signed)
Mail pt hand out in Donna's box

## 2020-03-26 NOTE — Telephone Encounter (Signed)
Patient returned Donna's call.  Patient scheduled appointment on 05/04/20.

## 2020-05-04 ENCOUNTER — Telehealth (INDEPENDENT_AMBULATORY_CARE_PROVIDER_SITE_OTHER): Payer: PRIVATE HEALTH INSURANCE | Admitting: Family Medicine

## 2020-05-04 ENCOUNTER — Encounter: Payer: Self-pay | Admitting: Family Medicine

## 2020-05-04 ENCOUNTER — Other Ambulatory Visit: Payer: Self-pay

## 2020-05-04 VITALS — Ht 61.5 in

## 2020-05-04 DIAGNOSIS — M810 Age-related osteoporosis without current pathological fracture: Secondary | ICD-10-CM

## 2020-05-04 MED ORDER — ALENDRONATE SODIUM 70 MG PO TABS: 70.0000 mg | ORAL_TABLET | ORAL | 11 refills | Status: DC

## 2020-05-04 NOTE — Patient Instructions (Signed)
Start alendronate and will recheck bone density in 2 years.  Osteoporosis  Osteoporosis happens when your bones get thin and weak. This can cause your bones to break (fracture) more easily. You can do things at home to make your bones stronger. Follow these instructions at home:  Activity  Exercise as told by your doctor. Ask your doctor what activities are safe for you. You should do: ? Exercises that make your muscles work to hold your body weight up (weight-bearing exercises). These include tai chi, yoga, and walking. ? Exercises to make your muscles stronger. One example is lifting weights. Lifestyle  Limit alcohol intake to no more than 1 drink a day for nonpregnant women and 2 drinks a day for men. One drink equals 12 oz of beer, 5 oz of wine, or 1 oz of hard liquor.  Do not use any products that have nicotine or tobacco in them. These include cigarettes and e-cigarettes. If you need help quitting, ask your doctor. Preventing falls  Use tools to help you move around (mobility aids) as needed. These include canes, walkers, scooters, and crutches.  Keep rooms well-lit and free of clutter.  Put away things that could make you trip. These include cords and rugs.  Install safety rails on stairs. Install grab bars in bathrooms.  Use rubber mats in slippery areas, like bathrooms.  Wear shoes that: ? Fit you well. ? Support your feet. ? Have closed toes. ? Have rubber soles or low heels.  Tell your doctor about all of the medicines you are taking. Some medicines can make you more likely to fall. General instructions  Eat plenty of calcium and vitamin D. These nutrients are good for your bones. Good sources of calcium and vitamin D include: ? Some fatty fish, such as salmon and tuna. ? Foods that have calcium and vitamin D added to them (fortified foods). For example, some breakfast cereals are fortified with calcium and vitamin D. ? Egg yolks. ? Cheese. ? Liver.  Take  over-the-counter and prescription medicines only as told by your doctor.  Keep all follow-up visits as told by your doctor. This is important. Contact a doctor if:  You have not been tested (screened) for osteoporosis and you are: ? A woman who is age 25 or older. ? A man who is age 22 or older. Get help right away if:  You fall.  You get hurt. Summary  Osteoporosis happens when your bones get thin and weak.  Weak bones can break (fracture) more easily.  Eat plenty of calcium and vitamin D. These nutrients are good for your bones.  Tell your doctor about all of the medicines that you take. This information is not intended to replace advice given to you by your health care provider. Make sure you discuss any questions you have with your health care provider. Document Revised: 07/06/2017 Document Reviewed: 05/18/2017 Elsevier Patient Education  2020 [employer].

## 2020-05-04 NOTE — Assessment & Plan Note (Signed)
Correction to 2012 note... never tried fosamax or actonel.   Recommend weight bearing exercise, calcium in diet and vit D supplement 400 IU 1-2 times daily.  Will check vit D with upcoming labs at [facility].  Start fosamax. Discussed med course, effect, administration and possible SE.

## 2020-05-04 NOTE — Progress Notes (Signed)
Interactive audio and video telecommunications were attempted between this provider and patient, however failed, due to patient having technical difficulties OR patient did not have access to video capability.  We continued and completed visit with audio only.   Virtual Visit via Telephone Note  I connected with patient on 05/04/20  at 4:50 PM  by telephone and verified that I am speaking with the correct person using two identifiers.  Location of patient: Home   Location of MD: Home Name of referring provider (if blank then none associated): Names per persons and role in encounter:  MD: Eliezer Lofts Christy Gallegos.    I discussed the limitations, risks, security and privacy concerns of performing an evaluation and management service by telephone and the availability of in person appointments. I also discussed with the patient that there may be a patient responsible charge related to this service. The patient expressed understanding and agreed to proceed.  .    Chief Complaint  Patient presents with  . Follow-up    Dexa Scan    History of Present Illness: HPI    64 year old female present to discuss DEXA results.  03/18/2020 DEXA T -2.9 in femur neck Previous for comparison at different site 2010 and 2012 normal/osteopenia T -1.4   Has not tried bisphosphonate..  On ca and vit D  600mg /400 IU BID Also added vit D 2000 IU   She is more active in last few  Years.. walking more.   This visit occurred during the SARS-CoV-2 public health emergency.  Safety protocols were in place, including screening questions prior to the visit, additional usage of staff PPE, and extensive cleaning of exam room while observing appropriate contact time as indicated for disinfecting solutions.   COVID 19 screen:  No recent travel or known exposure to COVID19 The patient denies respiratory symptoms of COVID 19 at this time. The importance of social distancing was discussed today.      Review of Systems  Constitutional: Negative for chills and fever.  HENT: Negative for congestion and ear pain.   Eyes: Negative for pain and redness.  Respiratory: Negative for cough and shortness of breath.   Cardiovascular: Negative for chest pain, palpitations and leg swelling.  Gastrointestinal: Negative for abdominal pain, blood in stool, constipation, diarrhea, nausea and vomiting.  Genitourinary: Negative for dysuria.  Musculoskeletal: Negative for falls and myalgias.  Skin: Negative for rash.  Neurological: Negative for dizziness.  Psychiatric/Behavioral: Negative for depression. The patient is not nervous/anxious.       History reviewed. No pertinent past medical history.  reports that she has never smoked. She has never used smokeless tobacco. She reports that she does not drink alcohol and does not use drugs.   Current Outpatient Medications:  .  Calcium Carbonate-Vitamin D (CALTRATE 600+D) 600-400 MG-UNIT per tablet, Take 1 tablet by mouth daily.  , Disp: , Rfl:  .  Cholecalciferol (VITAMIN D3) 50 MCG (2000 UT) CHEW, Chew 1 tablet by mouth daily., Disp: , Rfl:  .  fish oil-omega-3 fatty acids 1000 MG capsule, Take 1 g by mouth 2 (two) times daily.  , Disp: , Rfl:  .  L-Lysine 500 MG CAPS, Take 1 capsule by mouth daily.  , Disp: , Rfl:  .  loratadine (CLARITIN) 10 MG tablet, Take 10 mg by mouth daily.  , Disp: , Rfl:  .  Multiple Vitamins-Minerals (CENTRUM SILVER) tablet, Take 1 tablet by mouth daily.  , Disp: , Rfl:  .  vitamin C (ASCORBIC ACID) 500 MG tablet, Take 500 mg by mouth daily.  , Disp: , Rfl:  .  vitamin E 400 UNIT capsule, Take 400 Units by mouth daily.  , Disp: , Rfl:    Observations/Objective: Height 5' 1.5" (1.562 m).  Physical Exam  Physical Exam Constitutional:      General: The patient is not in acute distress. Pulmonary:     Effort: Pulmonary effort is normal. No respiratory distress.  Neurological:     Mental Status: The patient is alert  and oriented to person, place, and time.  Psychiatric:        Mood and Affect: Mood normal.        Behavior: Behavior normal.   Assessment and Plan   Osteoporosis Correction to 2012 note... never tried fosamax or actonel.   Recommend weight bearing exercise, calcium in diet and vit D supplement 400 IU 1-2 times daily.  Will check vit D with upcoming labs at [facility].  Start fosamax. Discussed med course, effect, administration and possible SE.  Total visit time 15 minutes, > 50% spent counseling and cordinating patients care.  I discussed the assessment and treatment plan with the patient. The patient was provided an opportunity to ask questions and all were answered. The patient agreed with the plan and demonstrated an understanding of the instructions.   The patient was advised to call back or seek an in-person evaluation if the symptoms worsen or if the condition fails to improve as anticipated.     Eliezer Lofts, MD

## 2020-05-24 IMAGING — MG DIGITAL SCREENING BILAT W/ TOMO W/ CAD
8 series · 9 of 24 positions shown · non-contrast
Comparison: Previous exam(s).

CLINICAL DATA: Screening.

EXAM:
DIGITAL SCREENING BILATERAL MAMMOGRAM WITH TOMO AND CAD

[R CC synth-2D]
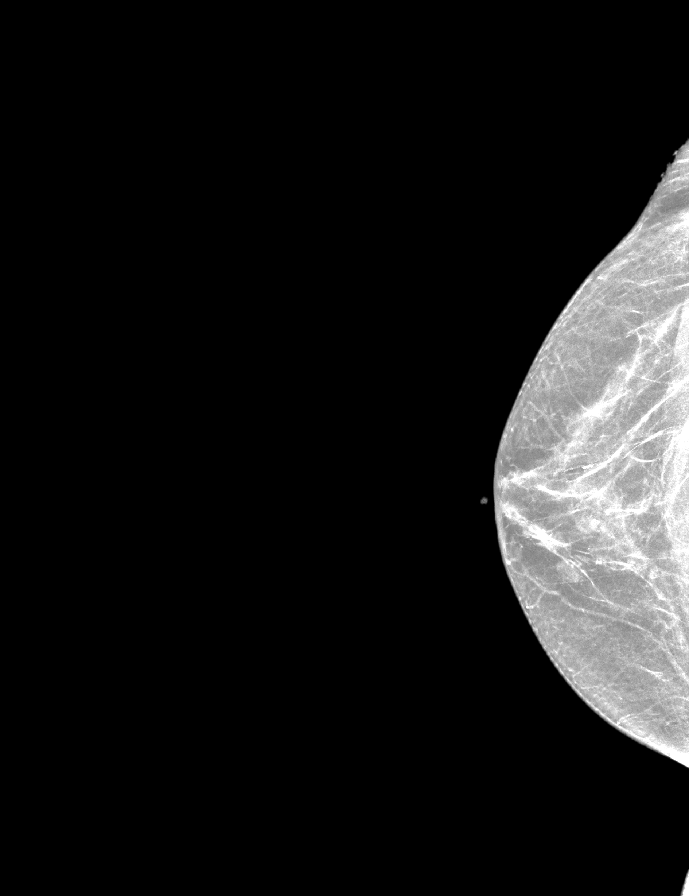

[R MLO synth-2D]
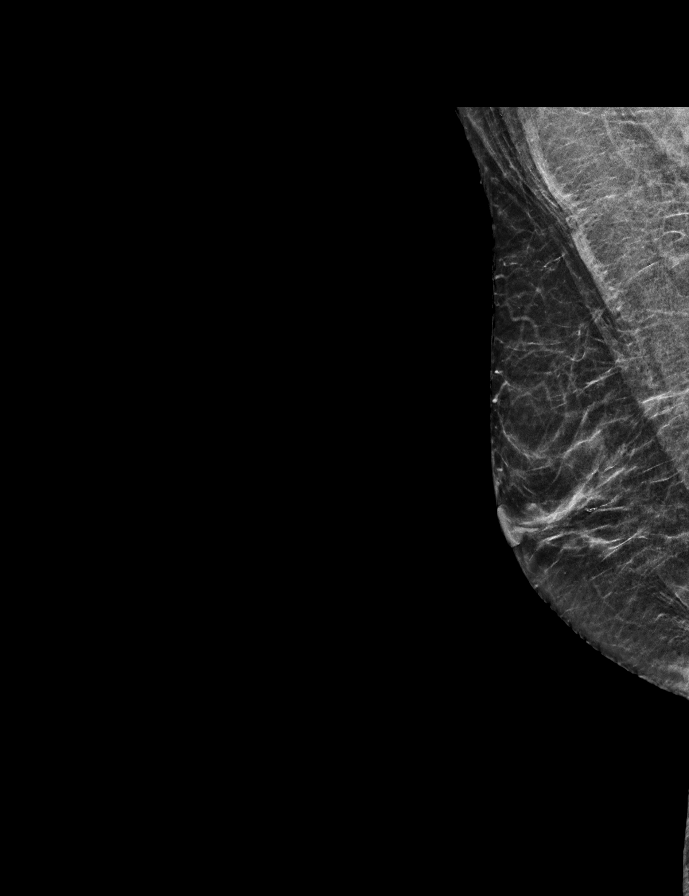

[L CC synth-2D]
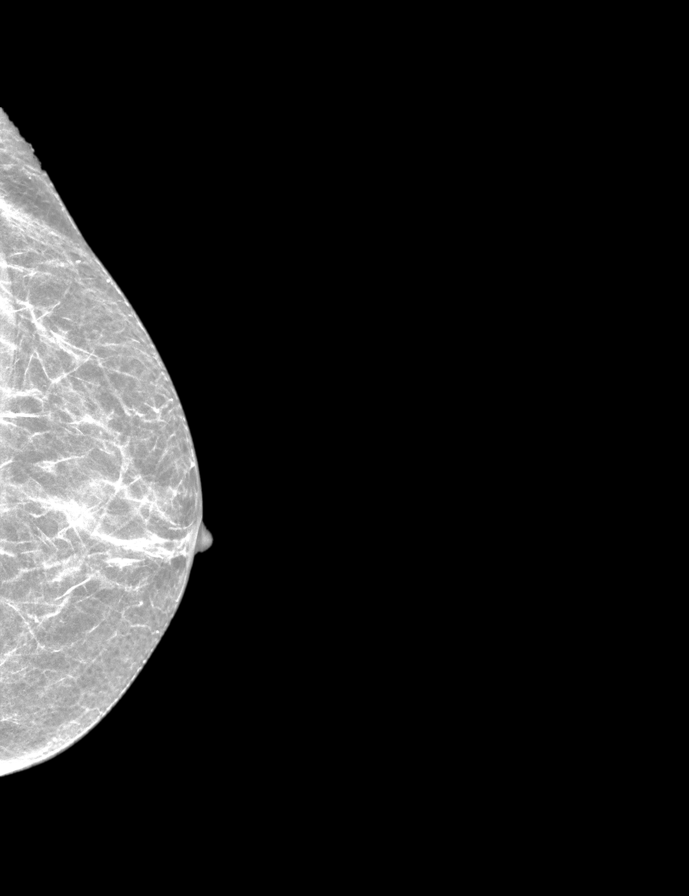

[L MLO synth-2D]
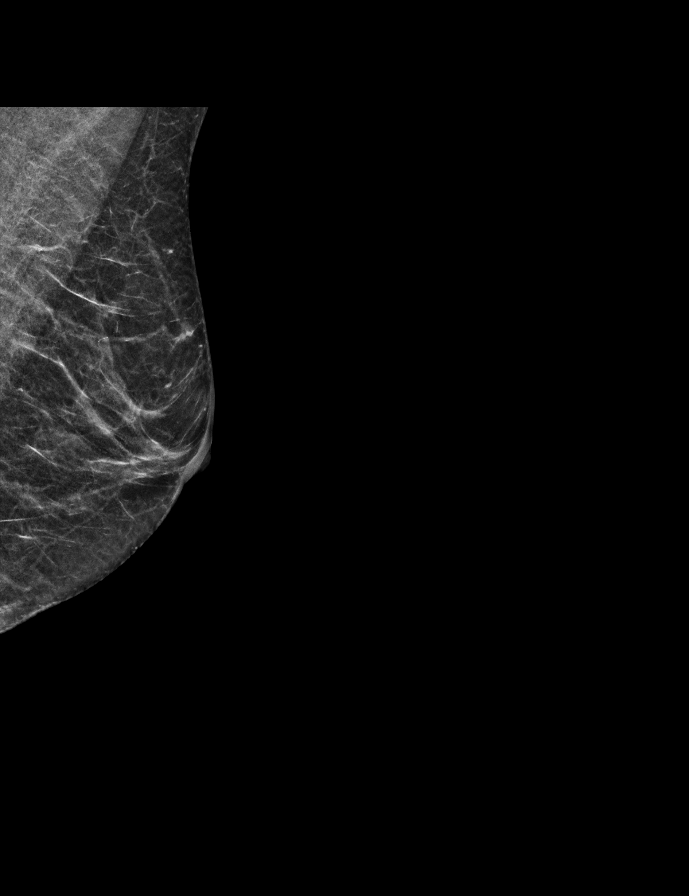

[L MLO tomo · 2 of 41 frames shown]
[frame 14/41]
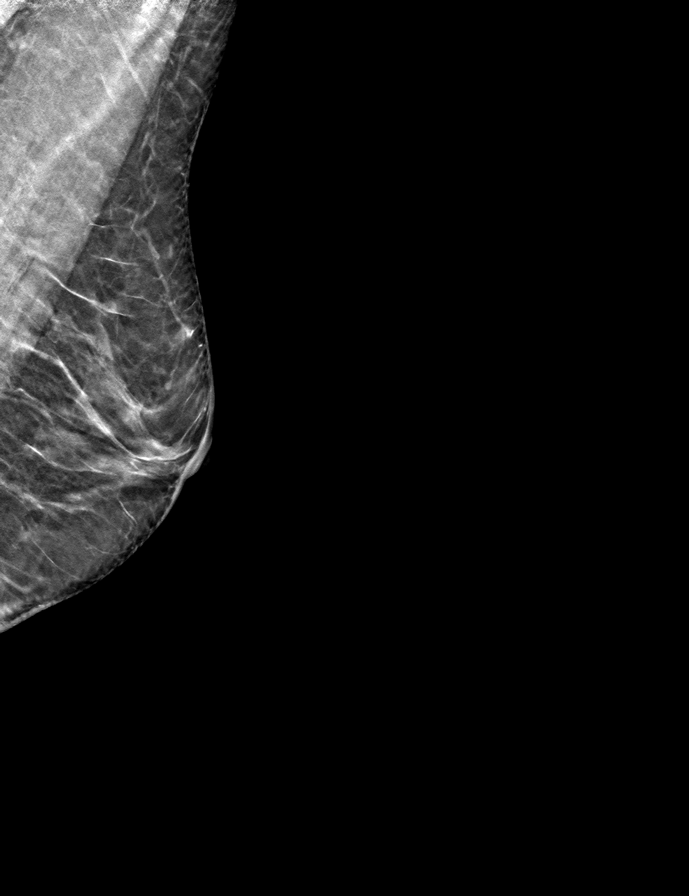
[frame 21/41]
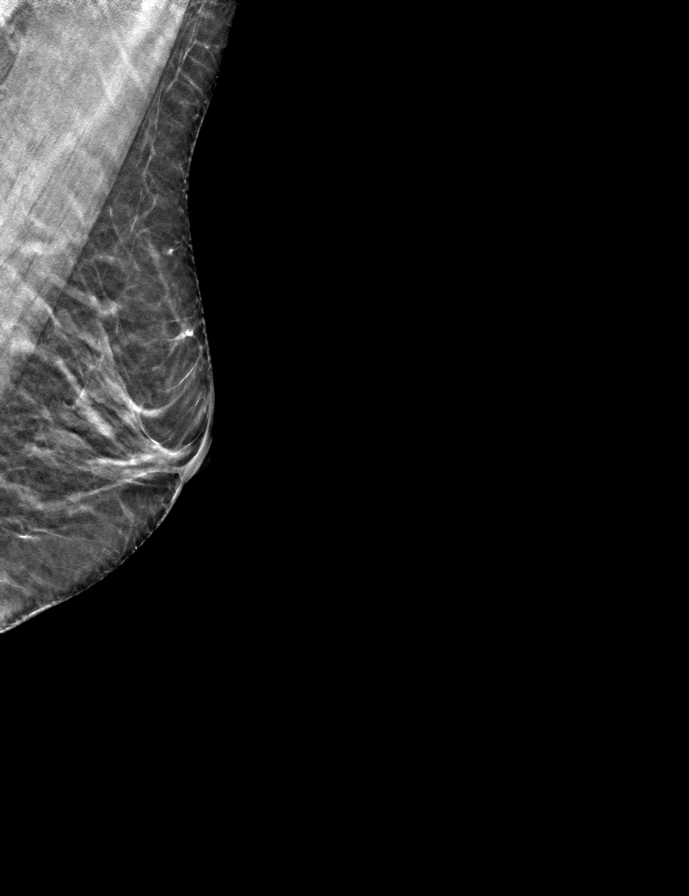

[L CC tomo · tomo slice 21/41.0]
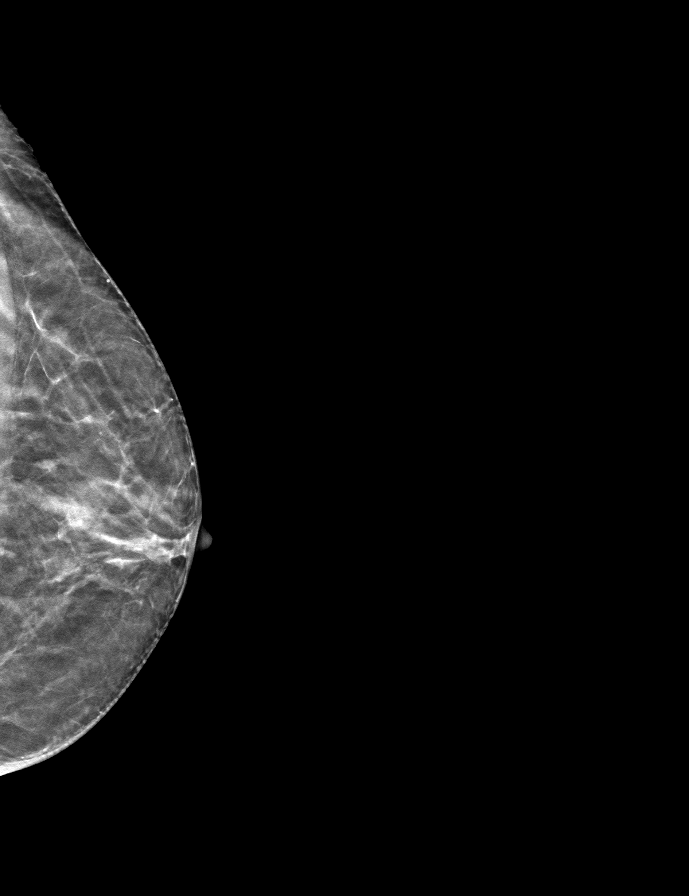

[R CC tomo · tomo slice 23/45.0]
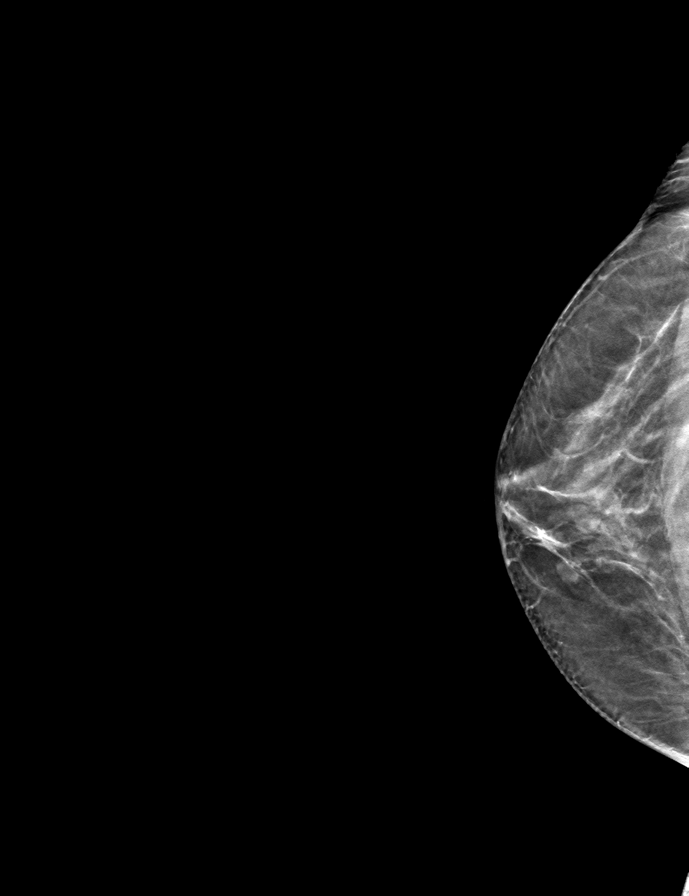

[R MLO tomo · tomo slice 23/45.0]
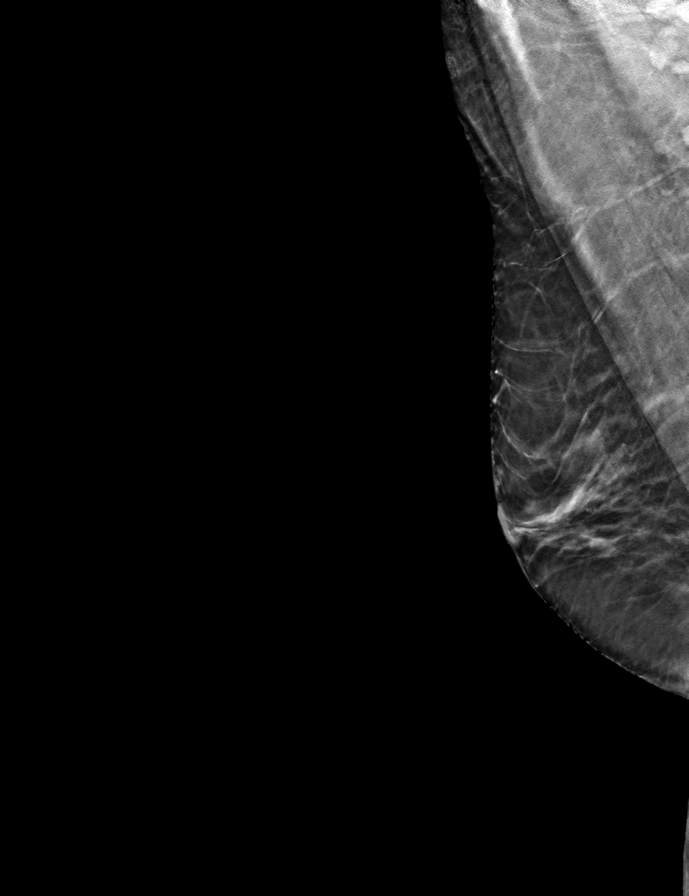

[9 of 24 positions shown; findings below may reference images not displayed]

ACR Breast Density Category b: There are scattered areas of
fibroglandular density.
FINDINGS: There are no findings suspicious for malignancy. Images were
processed with CAD.
IMPRESSION: No mammographic evidence of malignancy. A result letter of this
screening mammogram will be mailed directly to the patient.

RECOMMENDATION:
Screening mammogram in one year. (Code:[id number])

BI-RADS CATEGORY  1: Negative.

## 2020-07-29 ENCOUNTER — Other Ambulatory Visit: Payer: Self-pay | Admitting: Family Medicine

## 2020-07-29 DIAGNOSIS — Z1231 Encounter for screening mammogram for malignant neoplasm of breast: Secondary | ICD-10-CM

## 2020-09-14 ENCOUNTER — Telehealth: Payer: Self-pay | Admitting: Family Medicine

## 2020-09-14 NOTE — Telephone Encounter (Signed)
Rx with order in Donna's box in my office

## 2020-09-14 NOTE — Telephone Encounter (Signed)
Patient is wanting to have her labs for her cpe done in her office can we please have the order faxed to her at: [phone] or if we can mail it to her at home. Please advise the patient  EM

## 2020-09-14 NOTE — Telephone Encounter (Signed)
Lab orders faxed to [phone].  Left message for Jackelyn Poling that orders were faxed as requested.

## 2020-10-05 ENCOUNTER — Ambulatory Visit (INDEPENDENT_AMBULATORY_CARE_PROVIDER_SITE_OTHER): Payer: PRIVATE HEALTH INSURANCE | Admitting: Family Medicine

## 2020-10-05 ENCOUNTER — Other Ambulatory Visit: Payer: Self-pay

## 2020-10-05 ENCOUNTER — Encounter: Payer: Self-pay | Admitting: Family Medicine

## 2020-10-05 VITALS — BP 126/76 | HR 86 | Temp 98.5°F | Ht 61.25 in | Wt 113.0 lb

## 2020-10-05 DIAGNOSIS — E7800 Pure hypercholesterolemia, unspecified: Secondary | ICD-10-CM | POA: Diagnosis not present

## 2020-10-05 DIAGNOSIS — Z0000 Encounter for general adult medical examination without abnormal findings: Secondary | ICD-10-CM

## 2020-10-05 DIAGNOSIS — M810 Age-related osteoporosis without current pathological fracture: Secondary | ICD-10-CM | POA: Diagnosis not present

## 2020-10-05 DIAGNOSIS — Z1211 Encounter for screening for malignant neoplasm of colon: Secondary | ICD-10-CM

## 2020-10-05 NOTE — Patient Instructions (Addendum)
Plan Hep C test next year with work labs.  Pick up stool cards in labs on way out.  Let me know if any continue possible side effects of fosamax.

## 2020-10-05 NOTE — Assessment & Plan Note (Signed)
Good control on no med.  Encouraged exercise, weight loss, healthy eating habits.

## 2020-10-05 NOTE — Progress Notes (Signed)
Patient ID: [name], female    DOB: [birth year], 65 y.o.   MRN: [id number]  This visit was conducted in person.  BP 126/76   Pulse 86   Temp 98.5 F (36.9 C) (Temporal)   Ht 5' 1.25" (1.556 m)   Wt 113 lb (51.3 kg)   SpO2 97%   BMI 21.18 kg/m    CC:  Chief Complaint  Patient presents with  . Annual Exam    Subjective:   HPI: KRYSTALYN KUBOTA is a 66 y.o. female presenting on 10/05/2020 for Annual Exam   Elevated Cholesterol:  LDL 139, HDL 82 per outside records 09/30/20 Using medications without problems: Muscle aches:  Diet compliance: heart health diet Exercise: walking 30 min 3 times aweek. Other complaints:  PHQ9 SCORE ONLY 10/05/2020 10/03/2019 07/25/2015  PHQ-9 Total Score 0 0 0   Glucose 89  Cr 0.5 GFR: 102     Relevant past medical, surgical, family and social history reviewed and updated as indicated. Interim medical history since our last visit reviewed. Allergies and medications reviewed and updated. Outpatient Medications Prior to Visit  Medication Sig Dispense Refill  . alendronate (FOSAMAX) 70 MG tablet Take 1 tablet (70 mg total) by mouth every 7 (seven) days. Take with a full glass of water on an empty stomach. 4 tablet 11  . Calcium Carbonate-Vitamin D 600-400 MG-UNIT tablet Take 1 tablet by mouth daily.    . Cholecalciferol (VITAMIN D3) 50 MCG (2000 UT) CHEW Chew 1 tablet by mouth daily.    . fish oil-omega-3 fatty acids 1000 MG capsule Take 1 g by mouth 2 (two) times daily.    Marland Kitchen L-Lysine 500 MG CAPS Take 1 capsule by mouth daily.    Marland Kitchen loratadine (CLARITIN) 10 MG tablet Take 10 mg by mouth daily.    . Multiple Vitamins-Minerals (CENTRUM SILVER) tablet Take 1 tablet by mouth daily.    . vitamin C (ASCORBIC ACID) 500 MG tablet Take 500 mg by mouth daily.    . vitamin E 400 UNIT capsule Take 400 Units by mouth daily.     No facility-administered medications prior to visit.     Per HPI unless specifically indicated in ROS section below Review  of Systems  Constitutional: Negative for fatigue and fever.  HENT: Negative for congestion.   Eyes: Negative for pain.  Respiratory: Negative for cough and shortness of breath.   Cardiovascular: Negative for chest pain, palpitations and leg swelling.  Gastrointestinal: Negative for abdominal pain.  Genitourinary: Negative for dysuria and vaginal bleeding.  Musculoskeletal: Negative for back pain.  Neurological: Negative for syncope, light-headedness and headaches.  Psychiatric/Behavioral: Negative for dysphoric mood.   Objective:  BP 126/76   Pulse 86   Temp 98.5 F (36.9 C) (Temporal)   Ht 5' 1.25" (1.556 m)   Wt 113 lb (51.3 kg)   SpO2 97%   BMI 21.18 kg/m   Wt Readings from Last 3 Encounters:  10/05/20 113 lb (51.3 kg)  10/03/19 111 lb 8 oz (50.6 kg)  01/27/17 120 lb (54.4 kg)      Physical Exam Constitutional:      General: She is not in acute distress.Vital signs are normal.     Appearance: Normal appearance. She is well-developed and well-nourished. She is not ill-appearing or toxic-appearing.  HENT:     Head: Normocephalic.     Right Ear: Hearing, tympanic membrane, ear canal and external ear normal.     Left Ear: Hearing, tympanic membrane,  ear canal and external ear normal.     Nose: Nose normal.  Eyes:     General: Lids are normal. Lids are everted, no foreign bodies appreciated.     Extraocular Movements: EOM normal.     Conjunctiva/sclera: Conjunctivae normal.     Pupils: Pupils are equal, round, and reactive to light.  Neck:     Thyroid: No thyroid mass or thyromegaly.     Vascular: No carotid bruit.     Trachea: Trachea normal.  Cardiovascular:     Rate and Rhythm: Normal rate and regular rhythm.     Pulses: Intact distal pulses.     Heart sounds: Normal heart sounds, S1 normal and S2 normal. No murmur heard. No gallop.   Pulmonary:     Effort: Pulmonary effort is normal. No respiratory distress.     Breath sounds: Normal breath sounds. No wheezing,  rhonchi or rales.  Abdominal:     General: Bowel sounds are normal. There is no distension or abdominal bruit.     Palpations: Abdomen is soft. There is no fluid wave, hepatosplenomegaly or mass.     Tenderness: There is no abdominal tenderness. There is no CVA tenderness, guarding or rebound.     Hernia: No hernia is present.  Musculoskeletal:     Cervical back: Normal range of motion and neck supple.  Lymphadenopathy:     Cervical: No cervical adenopathy.     Upper Body:  No axillary adenopathy present. Skin:    General: Skin is warm, dry and intact.     Findings: No rash.  Neurological:     Mental Status: She is alert.     Cranial Nerves: No cranial nerve deficit.     Sensory: No sensory deficit.     Deep Tendon Reflexes: Strength normal.  Psychiatric:        Mood and Affect: Mood is not anxious or depressed.        Speech: Speech normal.        Behavior: Behavior normal. Behavior is cooperative.        Cognition and Memory: Cognition and memory normal.        Judgment: Judgment normal.       Results for orders placed or performed in visit on 10/03/19  Cytology - PAP([place])  Result Value Ref Range   High risk HPV Negative    Adequacy      Satisfactory for evaluation; transformation zone component PRESENT.   Diagnosis      - Negative for intraepithelial lesion or malignancy (NILM)   Comment Normal Reference Range HPV - Negative     This visit occurred during the SARS-CoV-2 public health emergency.  Safety protocols were in place, including screening questions prior to the visit, additional usage of staff PPE, and extensive cleaning of exam room while observing appropriate contact time as indicated for disinfecting solutions.   COVID 19 screen:  No recent travel or known exposure to COVID19 The patient denies respiratory symptoms of COVID 19 at this time. The importance of social distancing was discussed today.   Assessment and Plan   The patient's preventative  maintenance and recommended screening tests for an annual wellness exam were reviewed in full today. Brought up to date unless services declined.  Counselled on the importance of diet, exercise, and its role in overall health and mortality. The patient's FH and SH was reviewed, including their home life, tobacco status, and drug and alcohol status.   Vaccines: Uptodate with td,  COVID19  X 3, flu Pap/DVE:  09/2019 Mammo:  11/2019 Bone Density: 03/2020, worsening control at last check. Back on fosamax since 04/2020  No jaw pain. Colon:  2007 normal, plan ifob yearly Smoking Status: none ETOH/ drug use: none/none Hep C:   Plan in future HIV screen:  refused.  Problem List Items Addressed This Visit    High cholesterol    Good control on no med.  Encouraged exercise, weight loss, healthy eating habits.       Osteoporosis    Tolerating fosamax... not clearly cause of left thigh pain... follow.       Other Visit Diagnoses    Colon cancer screening    -  Primary   Relevant Orders   Fecal occult blood, imunochemical       Eliezer Lofts, MD

## 2020-10-05 NOTE — Assessment & Plan Note (Signed)
Tolerating fosamax... not clearly cause of left thigh pain... follow.

## 2020-11-15 ENCOUNTER — Other Ambulatory Visit: Payer: Self-pay

## 2020-11-15 ENCOUNTER — Ambulatory Visit
Admission: RE | Admit: 2020-11-15 | Discharge: 2020-11-15 | Disposition: A | Discharge status: Home / Self Care | Payer: PRIVATE HEALTH INSURANCE | Source: Ambulatory Visit | Attending: Family Medicine | Admitting: Family Medicine

## 2020-11-15 DIAGNOSIS — Z1231 Encounter for screening mammogram for malignant neoplasm of breast: Secondary | ICD-10-CM

## 2021-03-03 ENCOUNTER — Telehealth: Payer: Self-pay | Admitting: *Deleted

## 2021-03-03 NOTE — Telephone Encounter (Signed)
Pt called in wanting PCP to see her daughter... see separate phone note on daughter Darlyne Russian

## 2021-03-10 ENCOUNTER — Other Ambulatory Visit: Payer: Self-pay | Admitting: Family Medicine

## 2021-08-17 ENCOUNTER — Other Ambulatory Visit: Payer: Self-pay | Admitting: Family Medicine

## 2021-08-17 DIAGNOSIS — Z1231 Encounter for screening mammogram for malignant neoplasm of breast: Secondary | ICD-10-CM

## 2021-08-25 ENCOUNTER — Telehealth: Payer: Self-pay | Admitting: Family Medicine

## 2021-08-25 ENCOUNTER — Other Ambulatory Visit: Payer: Self-pay | Admitting: Family Medicine

## 2021-08-25 DIAGNOSIS — Z1159 Encounter for screening for other viral diseases: Secondary | ICD-10-CM

## 2021-08-25 NOTE — Telephone Encounter (Signed)
Christy Gallegos called in and wanted to kniow if she can get labs faxed to [facility] due to she works there and can get her labs done at work and the fax number [phone] and the order will go directly to her.

## 2021-08-25 NOTE — Telephone Encounter (Signed)
See prescription in donna's in box

## 2021-08-25 NOTE — Telephone Encounter (Signed)
Please write orders for CPE labs and I will fax over to [facility].

## 2021-08-26 ENCOUNTER — Other Ambulatory Visit: Payer: Self-pay | Admitting: Family Medicine

## 2021-08-26 NOTE — Telephone Encounter (Signed)
Lab orders faxed to [facility] as requested at [phone].

## 2021-10-04 LAB — HEPATIC FUNCTION PANEL
ALT: 21 (ref 7–35)
AST: 32 (ref 13–35)
Alkaline Phosphatase: 38 (ref 25–125)
Bilirubin, Total: 1

## 2021-10-04 LAB — HM HEPATITIS C SCREENING LAB: HM Hepatitis Screen: NEGATIVE

## 2021-10-04 LAB — BASIC METABOLIC PANEL
BUN: 9 (ref 4–21)
CO2: 29 — AB (ref 13–22)
Chloride: 102 (ref 99–108)
Creatinine: 0.5 (ref 0.5–1.1)
Glucose: 84
Potassium: 4.2 (ref 3.4–5.3)
Sodium: 134 — AB (ref 137–147)

## 2021-10-04 LAB — COMPREHENSIVE METABOLIC PANEL WITH GFR
Albumin: 4.2 (ref 3.5–5.0)
Calcium: 9.3 (ref 8.7–10.7)
eGFR: 101.6

## 2021-10-04 LAB — LIPID PANEL
Cholesterol: 238 — AB (ref 0–200)
HDL: 84 — AB (ref 35–70)
LDL Cholesterol: 132
Triglycerides: 112 (ref 40–160)

## 2021-10-07 ENCOUNTER — Ambulatory Visit (INDEPENDENT_AMBULATORY_CARE_PROVIDER_SITE_OTHER): Payer: No Typology Code available for payment source | Admitting: Family Medicine

## 2021-10-07 ENCOUNTER — Other Ambulatory Visit: Payer: Self-pay

## 2021-10-07 ENCOUNTER — Encounter: Payer: Self-pay | Admitting: Family Medicine

## 2021-10-07 VITALS — BP 120/70 | HR 82 | Temp 98.2°F | Ht 61.25 in | Wt 113.1 lb

## 2021-10-07 DIAGNOSIS — E7800 Pure hypercholesterolemia, unspecified: Secondary | ICD-10-CM | POA: Diagnosis not present

## 2021-10-07 DIAGNOSIS — Z23 Encounter for immunization: Secondary | ICD-10-CM | POA: Diagnosis not present

## 2021-10-07 DIAGNOSIS — Z1211 Encounter for screening for malignant neoplasm of colon: Secondary | ICD-10-CM

## 2021-10-07 DIAGNOSIS — Z0000 Encounter for general adult medical examination without abnormal findings: Secondary | ICD-10-CM | POA: Diagnosis not present

## 2021-10-07 DIAGNOSIS — M810 Age-related osteoporosis without current pathological fracture: Secondary | ICD-10-CM

## 2021-10-07 NOTE — Assessment & Plan Note (Signed)
Chronic, stable on no medication. Continue working on heart healthy lifestyle. 

## 2021-10-07 NOTE — Patient Instructions (Addendum)
Consider shingrix vaccine. ? Pick up colon cancer screening kit in labs. ? ?Please call the location of your choice from the menu below to schedule your Mammogram and/or Bone Density appointment.   ? ?[place]  ? ?[facility]                ?      Phone:  [phone] ?[address]                               ?[place], [place] [address]                                                             ?Services: Traditional and 3D Mammogram, Bone Density  ? ?[facility]           ?      Phone: [phone] ?[address]                                                       ?[place], [place] [address]    ?Service: Bone Density ONLY  ? *this site does NOT perform mammograms ? ?[facility]                       ? Phone:  [phone] ?[address]                                  ?[place], [place] [address]                                            ?Services:  3D Mammogram and Bone Density  ? ? ? ?

## 2021-10-07 NOTE — Progress Notes (Signed)
? ? Patient ID: [name], female    DOB: [birth year], 66 y.o.   MRN: [id number] ? ?This visit was conducted in person. ? ?BP 120/70   Pulse 82   Temp 98.2 ?F (36.8 ?C) (Oral)   Ht 5' 1.25" (1.556 m)   Wt 113 lb 1 oz (51.3 kg)   SpO2 100%   BMI 21.19 kg/m?   ? ?CC:  ?Chief Complaint  ?Patient presents with  ? Annual Exam  ? ? ?Subjective:  ? ?HPI: ?SHAMAINE MULKERN is a 66 y.o. female presenting on 10/07/2021 for Annual Exam ? Reviewed outside labs ? CMET unremarkable. ? ?Elevated Cholesterol:  ? Total chol 238 hdl 84 trig 112 LDL 132 ?Using medications without problems: ?Muscle aches:  ?Diet compliance good ?Exercise: walking ?Other complaints: ? ? ?   ? ?Relevant past medical, surgical, family and social history reviewed and updated as indicated. Interim medical history since our last visit reviewed. ?Allergies and medications reviewed and updated. ?Outpatient Medications Prior to Visit  ?Medication Sig Dispense Refill  ? alendronate (FOSAMAX) 70 MG tablet TAKE 1 TABLET (70 MG TOTAL) BY MOUTH EVERY 7 (SEVEN) DAYS. TAKE WITH A FULL GLASS OF WATER ON AN EMPTY STOMACH. 12 tablet 0  ? Calcium Carbonate-Vitamin D 600-400 MG-UNIT tablet Take 1 tablet by mouth daily.    ? Cholecalciferol (VITAMIN D3) 50 MCG (2000 UT) CHEW Chew 1 tablet by mouth daily.    ? fish oil-omega-3 fatty acids 1000 MG capsule Take 1 g by mouth 2 (two) times daily.    ? L-Lysine 500 MG CAPS Take 1 capsule by mouth daily.    ? loratadine (CLARITIN) 10 MG tablet Take 10 mg by mouth daily.    ? Multiple Vitamins-Minerals (CENTRUM SILVER) tablet Take 1 tablet by mouth daily.    ? vitamin C (ASCORBIC ACID) 500 MG tablet Take 500 mg by mouth daily.    ? vitamin E 400 UNIT capsule Take 400 Units by mouth daily.    ? ?No facility-administered medications prior to visit.  ?  ? ?Per HPI unless specifically indicated in ROS section below ?Review of Systems  ?Constitutional:  Negative for fatigue and fever.  ?HENT:  Negative for congestion.   ?Eyes:   Negative for pain.  ?Respiratory:  Negative for cough and shortness of breath.   ?Cardiovascular:  Negative for chest pain, palpitations and leg swelling.  ?Gastrointestinal:  Negative for abdominal pain.  ?Genitourinary:  Negative for dysuria and vaginal bleeding.  ?Musculoskeletal:  Negative for back pain.  ?Neurological:  Negative for syncope, light-headedness and headaches.  ?Psychiatric/Behavioral:  Negative for dysphoric mood.   ?Objective:  ?BP 120/70   Pulse 82   Temp 98.2 ?F (36.8 ?C) (Oral)   Ht 5' 1.25" (1.556 m)   Wt 113 lb 1 oz (51.3 kg)   SpO2 100%   BMI 21.19 kg/m?   ?Wt Readings from Last 3 Encounters:  ?10/07/21 113 lb 1 oz (51.3 kg)  ?10/05/20 113 lb (51.3 kg)  ?10/03/19 111 lb 8 oz (50.6 kg)  ?  ?  ?Physical Exam ?Vitals and nursing note reviewed.  ?Constitutional:   ?   General: She is not in acute distress. ?   Appearance: Normal appearance. She is well-developed. She is not ill-appearing or toxic-appearing.  ?HENT:  ?   Head: Normocephalic.  ?   Right Ear: Hearing, tympanic membrane, ear canal and external ear normal.  ?   Left Ear: Hearing, tympanic membrane, ear canal and external  ear normal.  ?   Nose: Nose normal.  ?Eyes:  ?   General: Lids are normal. Lids are everted, no foreign bodies appreciated.  ?   Conjunctiva/sclera: Conjunctivae normal.  ?   Pupils: Pupils are equal, round, and reactive to light.  ?Neck:  ?   Thyroid: No thyroid mass or thyromegaly.  ?   Vascular: No carotid bruit.  ?   Trachea: Trachea normal.  ?Cardiovascular:  ?   Rate and Rhythm: Normal rate and regular rhythm.  ?   Heart sounds: Normal heart sounds, S1 normal and S2 normal. No murmur heard. ?  No gallop.  ?Pulmonary:  ?   Effort: Pulmonary effort is normal. No respiratory distress.  ?   Breath sounds: Normal breath sounds. No wheezing, rhonchi or rales.  ?Abdominal:  ?   General: Bowel sounds are normal. There is no distension or abdominal bruit.  ?   Palpations: Abdomen is soft. There is no fluid wave  or mass.  ?   Tenderness: There is no abdominal tenderness. There is no guarding or rebound.  ?   Hernia: No hernia is present.  ?Musculoskeletal:  ?   Cervical back: Normal range of motion and neck supple.  ?Lymphadenopathy:  ?   Cervical: No cervical adenopathy.  ?Skin: ?   General: Skin is warm and dry.  ?   Findings: No rash.  ?   Comments: Bruising at site of blood draw, no hematoma no signs and symptoms of infection  ?Neurological:  ?   Mental Status: She is alert.  ?   Cranial Nerves: No cranial nerve deficit.  ?   Sensory: No sensory deficit.  ?Psychiatric:     ?   Mood and Affect: Mood is not anxious or depressed.     ?   Speech: Speech normal.     ?   Behavior: Behavior normal. Behavior is cooperative.     ?   Judgment: Judgment normal.  ? ?   ?Results for orders placed or performed in visit on 10/03/19  ?Cytology - PAP([place])  ?Result Value Ref Range  ? High risk HPV Negative   ? Adequacy    ?  Satisfactory for evaluation; transformation zone component PRESENT.  ? Diagnosis    ?  - Negative for intraepithelial lesion or malignancy (NILM)  ? Comment Normal Reference Range HPV - Negative   ? ? ?This visit occurred during the SARS-CoV-2 public health emergency.  Safety protocols were in place, including screening questions prior to the visit, additional usage of staff PPE, and extensive cleaning of exam room while observing appropriate contact time as indicated for disinfecting solutions.  ? ?COVID 19 screen:  No recent travel or known exposure to [place] ?The patient denies respiratory symptoms of COVID 19 at this time. ?The importance of social distancing was discussed today.  ? ?Assessment and Plan ?The patient's preventative maintenance and recommended screening tests for an annual wellness exam were reviewed in full today. ?Brought up to date unless services declined. ? ?Counselled on the importance of diet, exercise, and its role in overall health and mortality. ?The patient's FH and SH was  reviewed, including their home life, tobacco status, and drug and alcohol status.  ? ?Vaccines: Uptodate with td, COVID19  X 3, flu 05/2021 ?Pap/DVE:  09/2019, no further indicated, but will continue DVE exams every 1-2 years per pt request. ?Mammo:  11/2020 ?Bone Density: 03/2020, worsening control at last check. Back on fosamax since 04/2020  No jaw pain. ?Colon:  2007 normal, plan ifob yearly ?Smoking Status: none ?ETOH/ drug use: none/none ? Hep C:   done, negative ? HIV screen:   refused. ?  ?Problem List Items Addressed This Visit   ? ? High cholesterol  ?  Chronic, stable on no medication. ?Continue working on heart healthy lifestyle. ?  ?  ? Osteoporosis  ?  Chronic, due for repeat DEXA August 2023 ? ?Has been on Fosamax since 2021. ?  ?  ? Relevant Orders  ? DG Bone Density  ? ?Other Visit Diagnoses   ? ? Routine general medical examination at a health care facility    -  Primary  ? Relevant Orders  ? Pneumococcal conjugate vaccine 20-valent (Prevnar 20) (Completed)  ? Tdap vaccine greater than or equal to 7yo IM (Completed)  ? Colon cancer screening      ? Relevant Orders  ? Fecal occult blood, imunochemical([facility])  ? Need for vaccination against Streptococcus pneumoniae      ? Relevant Orders  ? Pneumococcal conjugate vaccine 20-valent (Prevnar 20) (Completed)  ? Need for Tdap vaccination      ? Relevant Orders  ? Tdap vaccine greater than or equal to 7yo IM (Completed)  ? ?  ? ? ? ?Eliezer Lofts, MD  ? ?

## 2021-10-07 NOTE — Assessment & Plan Note (Signed)
Chronic, due for repeat DEXA August 2023 ? ?Has been on Fosamax since 2021. ?

## 2021-11-07 ENCOUNTER — Other Ambulatory Visit: Payer: Self-pay | Admitting: Family Medicine

## 2021-11-17 ENCOUNTER — Ambulatory Visit
Admission: RE | Admit: 2021-11-17 | Discharge: 2021-11-17 | Disposition: A | Discharge status: Home / Self Care | Payer: No Typology Code available for payment source | Source: Ambulatory Visit | Attending: Family Medicine | Admitting: Family Medicine

## 2021-11-17 DIAGNOSIS — Z1231 Encounter for screening mammogram for malignant neoplasm of breast: Secondary | ICD-10-CM

## 2022-01-30 ENCOUNTER — Other Ambulatory Visit: Payer: Self-pay | Admitting: Family Medicine

## 2022-04-03 ENCOUNTER — Encounter (HOSPITAL_COMMUNITY): Payer: Self-pay | Admitting: Emergency Medicine

## 2022-04-03 ENCOUNTER — Emergency Department (HOSPITAL_COMMUNITY): Payer: PRIVATE HEALTH INSURANCE

## 2022-04-03 ENCOUNTER — Emergency Department (HOSPITAL_COMMUNITY)
Admission: EM | Admit: 2022-04-03 | Discharge: 2022-04-03 | Disposition: A | Discharge status: Home / Self Care | Payer: PRIVATE HEALTH INSURANCE | Attending: Emergency Medicine | Admitting: Emergency Medicine

## 2022-04-03 ENCOUNTER — Other Ambulatory Visit: Payer: Self-pay

## 2022-04-03 ENCOUNTER — Other Ambulatory Visit: Payer: No Typology Code available for payment source

## 2022-04-03 ENCOUNTER — Telehealth: Payer: Self-pay | Admitting: Family Medicine

## 2022-04-03 DIAGNOSIS — R55 Syncope and collapse: Secondary | ICD-10-CM | POA: Insufficient documentation

## 2022-04-03 LAB — URINALYSIS, ROUTINE W REFLEX MICROSCOPIC
Bilirubin Urine: NEGATIVE
Glucose, UA: NEGATIVE mg/dL
Hgb urine dipstick: NEGATIVE
Ketones, ur: NEGATIVE mg/dL
Leukocytes,Ua: NEGATIVE
Nitrite: NEGATIVE
Protein, ur: NEGATIVE mg/dL
Specific Gravity, Urine: 1.003 — ABNORMAL LOW (ref 1.005–1.030)
pH: 6 (ref 5.0–8.0)

## 2022-04-03 LAB — CBC
HCT: 41.8 % (ref 36.0–46.0)
Hemoglobin: 14.6 g/dL (ref 12.0–15.0)
MCH: 32 pg (ref 26.0–34.0)
MCHC: 34.9 g/dL (ref 30.0–36.0)
MCV: 91.7 fL (ref 80.0–100.0)
Platelets: 275 10*3/uL (ref 150–400)
RBC: 4.56 MIL/uL (ref 3.87–5.11)
RDW: 11.8 % (ref 11.5–15.5)
WBC: 8 10*3/uL (ref 4.0–10.5)
nRBC: 0 % (ref 0.0–0.2)

## 2022-04-03 LAB — BASIC METABOLIC PANEL
Anion gap: 8 (ref 5–15)
BUN: 7 mg/dL — ABNORMAL LOW (ref 8–23)
CO2: 25 mmol/L (ref 22–32)
Calcium: 9.2 mg/dL (ref 8.9–10.3)
Chloride: 101 mmol/L (ref 98–111)
Creatinine, Ser: 0.76 mg/dL (ref 0.44–1.00)
GFR, Estimated: >60 mL/min (ref >60)
Glucose, Bld: 109 mg/dL — ABNORMAL HIGH (ref 70–99)
Potassium: 5 mmol/L (ref 3.5–5.1)
Sodium: 134 mmol/L — ABNORMAL LOW (ref 135–145)

## 2022-04-03 LAB — TROPONIN I (HIGH SENSITIVITY): Troponin I (High Sensitivity): <2 ng/L (ref <18)

## 2022-04-03 NOTE — Telephone Encounter (Signed)
Agree with need for emergent evaluation at ER.

## 2022-04-03 NOTE — Discharge Instructions (Addendum)
You had a reassuring work up today. Follow up with your primary doctor. Return to ED as needed or for any new concerns.

## 2022-04-03 NOTE — Telephone Encounter (Signed)
[facility] - Client TELEPHONE ADVICE RECORD [facility] Patient Name: Christy Gallegos Gender: Female DOB: May 03, 1956 Age: 66 Y 3 M 25 D Return Phone Number: [phone] (Primary) Address: City/ State/ Zip: [place] [address] Client [facility] - Client Client Site [facility] - Day Provider Eliezer Lofts - MD Contact Type Call Who Is Calling Patient / Member / Family / Caregiver Call Type Triage / Clinical Relationship To Patient Self Return Phone Number [phone] (Primary) Chief Complaint Neck Pain Reason for Call Symptomatic / Request for Health Information Initial Comment She blacked out on Saturday. Today she states she does not feel normal, some neck pain from where she fell Sat. Translation No Nurse Assessment Nurse: Raenette Rover, RN, Zella Ball Date/Time (Eastern Time): 04/03/2022 10:07:30 AM Confirm and document reason for call. If symptomatic, describe symptoms. ---She blacked out on Saturday. Denies any diabetes. Had been outside when it happened. Her son n law was there and he is a Agricultural consultant and he tool bp 110/90, with standing couldn't get a reading removed herself from the heat and drank some extra fluids. Yesterday bp 128/76 sitting and standing was 110/70 and blood sugar was 110. Today she states she does not feel normal, some neck pain from where she fell Sat. 109/71 and 121/68 this morning. Denies hitting her head during the fall. She fell into another person standing close by. The back of her neck feels odd. Denies any stiffness just states feel strange. Does the patient have any new or worsening symptoms? ---Yes Will a triage be completed? ---Yes Related visit to physician within the last 2 weeks? ---No Does the PT have any chronic conditions? (i.e. diabetes, asthma, this includes High risk factors for pregnancy, etc.) ---Yes List chronic conditions. ---osteoporosis Is this a  behavioral health or substance abuse call? ---No PLEASE NOTE: All timestamps contained within this report are represented as [place] Standard Time. CONFIDENTIALTY NOTICE: This fax transmission is intended only for the addressee. It contains information that is legally privileged, confidential or otherwise protected from use or disclosure. If you are not the intended recipient, you are strictly prohibited from reviewing, disclosing, copying using or disseminating any of this information or taking any action in reliance on or regarding this information. If you have received this fax in error, please notify us immediately by telephone so that we can arrange for its return to [place]. Phone: [phone], Toll-Free: [phone], Fax: [phone] Page: 2 of 2 Call Id: [id number] Guidelines Guideline Title Affirmed Question Affirmed Notes Nurse Date/Time Eilene Ghazi Time) Fainting Age > 50 years (Exception: Occurred > 1 hour ago AND now feels completely fine.) Raenette Rover, RN, Zella Ball 04/03/2022 10:13:11 AM Disp. Time Eilene Ghazi Time) Disposition Final User 04/03/2022 10:19:03 AM Call EMS 911 Now Yes Raenette Rover, RN, Zella Ball 04/03/2022 10:28:27 AM 911 Outcome Documentation Raenette Rover, RN, Zella Ball Reason: Walking across the road to [employer] . Final Disposition 04/03/2022 10:19:03 AM Call EMS 911 Now Yes Raenette Rover, RN, Herbert Deaner Disagree/Comply Comply Caller Understands Yes PreDisposition Call Doctor Care Advice Given Per Guideline CALL EMS 911 NOW: CARE ADVICE given per Fainting (Adult) guideline. Comments User: Wilson Singer, RN Date/Time Eilene Ghazi Time): 04/03/2022 10:22:23 AM EMS was still recommended due to she fainted even though its been over an hour ago. Happened Saturday and still not feeling right. Caller refuses outcome advise and wants to make an appt. User: Wilson Singer, RN Date/Time Eilene Ghazi Time): 04/03/2022 10:30:06 AM At first caller refused going to the hosptial and  I tried to notify the back  line and trnafer her to the office and after long wait time was telling the caller I would just transfer her to wait ont he line for them but she decided to go onto the hospital that is right across the road from her. There was a long wait time and unable to get call through to the back line. Referrals [facility] - E

## 2022-04-03 NOTE — Telephone Encounter (Signed)
Unable to reach pt by phone; left v/m to call [facility] on pts cell. I called pt is at work but got her work v/m left v/m requesting pt to call [facility] to verify if pt is going to ED.sending note to Dr Diona Browner who is out of office and Butch Penny CMA.

## 2022-04-03 NOTE — ED Triage Notes (Signed)
Patient here POV with concern as patient had a syncopal episode, was caught by son so she doesn't think she hit anything, however patient unsure if she hit her head during the event. Patient did not come for evaluation then but is concerned because she is still feeling weak and lightheaded. Patient Aox4. Patient denies chest pain, n/v/d, SHOB, fevers, chills. Only complaining of a neck ache at this time.

## 2022-04-03 NOTE — ED Provider Triage Note (Signed)
Emergency Medicine Provider Triage Evaluation Note  Christy Gallegos , a 66 y.o. female  was evaluated in triage.  Pt complains of patient was working in the yard Saturday when she had a syncopal episode.  This was witnessed by her son who is a Airline pilot.  He did advise her to come in to be evaluated but states she did not want to.  She continues to feel unwell so decided to come in today.  She was working out in the yard 3 hours prior to this episode.  States it was significantly hot that day.  She denies chest pain.  Reports she felt heaviness all over, and when she came out of her syncopal episode she was diaphoretic.  Denies chest pain, shortness of breath, or palpitations.  Review of Systems  Positive: As above Negative: As above  Physical Exam  BP (!) 167/83 (BP Location: Right Arm)   Pulse 86   Temp 98.4 F (36.9 C) (Oral)   Resp 14   SpO2 100%  Gen:   Awake, no distress   Resp:  Normal effort  MSK:   Moves extremities without difficulty  Other:    Medical Decision Making  Medically screening exam initiated at 12:02 PM.  Appropriate orders placed.  [name] was informed that the remainder of the evaluation will be completed by another provider, this initial triage assessment does not replace that evaluation, and the importance of remaining in the ED until their evaluation is complete.     Evlyn Courier, PA-C 04/03/22 1204

## 2022-04-03 NOTE — ED Provider Notes (Signed)
[place] EMERGENCY DEPARTMENT Provider Note   CSN: [id number] Arrival date & time: 04/03/22  1110     History {Add pertinent medical, surgical, social history, OB history to HPI:1} Chief Complaint  Patient presents with   Near Syncope    Christy Gallegos is a 66 y.o. female. Presenting today after a syncopal event 2 days ago.  At that time patient had been mowing the lawn outside in the heat for 3 hours.  She then began to feel heaviness in her bilateral upper and lower extremities.  She then syncopized and was caught by her relative.  She did not hit her head.  She was lowered to the ground and then had immediate return to baseline.  No seizure activity was noted.  Patient syncopized one other time multiple years ago.  She denies any preceding chest pain or shortness of breath.  She denies any new medications.  Denies any seizure history.  That time she has continued to not feel quite like herself.  She has had fatigue.  She denies any lightheadedness, dizziness, chest pain, or shortness of breath.  Near Syncope Pertinent negatives include no chest pain.       Home Medications Prior to Admission medications   Medication Sig Start Date End Date Taking? Authorizing Provider  alendronate (FOSAMAX) 70 MG tablet TAKE 1 TABLET (70 MG TOTAL) BY MOUTH EVERY 7 DAYS WITH FULL GLASS WATER ON EMPTY STOMACH 01/30/22   Bedsole, Amy E, MD  Calcium Carbonate-Vitamin D 600-400 MG-UNIT tablet Take 1 tablet by mouth daily.    [provider]  Cholecalciferol (VITAMIN D3) 50 MCG (2000 UT) CHEW Chew 1 tablet by mouth daily.    [provider]  fish oil-omega-3 fatty acids 1000 MG capsule Take 1 g by mouth 2 (two) times daily.    [provider]  L-Lysine 500 MG CAPS Take 1 capsule by mouth daily.    [provider]  loratadine (CLARITIN) 10 MG tablet Take 10 mg by mouth daily.    [provider]  Multiple Vitamins-Minerals (CENTRUM SILVER)  tablet Take 1 tablet by mouth daily.    [provider]  vitamin C (ASCORBIC ACID) 500 MG tablet Take 500 mg by mouth daily.    [provider]  vitamin E 400 UNIT capsule Take 400 Units by mouth daily.    [provider]      Allergies    Patient has no known allergies.    Review of Systems   Review of Systems  Cardiovascular:  Positive for near-syncope. Negative for chest pain and palpitations.  Neurological:  Positive for syncope. Negative for dizziness, seizures, speech difficulty, weakness and light-headedness.    Physical Exam Updated Vital Signs BP 129/84   Pulse 74   Temp 98.1 F (36.7 C) (Oral)   Resp 15   SpO2 100%  Physical Exam Vitals and nursing note reviewed.  Constitutional:      General: She is not in acute distress.    Appearance: She is well-developed.  HENT:     Head: Normocephalic and atraumatic.  Eyes:     Conjunctiva/sclera: Conjunctivae normal.  Cardiovascular:     Rate and Rhythm: Normal rate and regular rhythm.     Heart sounds: No murmur heard. Pulmonary:     Effort: Pulmonary effort is normal. No respiratory distress.     Breath sounds: Normal breath sounds.  Abdominal:     Palpations: Abdomen is soft.     Tenderness:  There is no abdominal tenderness.  Musculoskeletal:        General: No swelling.     Cervical back: Neck supple.  Skin:    General: Skin is warm and dry.     Capillary Refill: Capillary refill takes less than 2 seconds.  Neurological:     General: No focal deficit present.     Mental Status: She is alert.     GCS: GCS eye subscore is 4. GCS verbal subscore is 5. GCS motor subscore is 6.     Cranial Nerves: Cranial nerves 2-12 are intact.     Sensory: Sensation is intact.     Motor: Motor function is intact.     Coordination: Coordination is intact.     Gait: Gait is intact.  Psychiatric:        Mood and Affect: Mood normal.     ED Results / Procedures / Treatments   Labs (all labs ordered  are listed, but only abnormal results are displayed) Labs Reviewed  BASIC METABOLIC PANEL - Abnormal; Notable for the following components:      Result Value   Sodium 134 (*)    Glucose, Bld 109 (*)    BUN 7 (*)    All other components within normal limits  URINALYSIS, ROUTINE W REFLEX MICROSCOPIC - Abnormal; Notable for the following components:   Color, Urine STRAW (*)    Specific Gravity, Urine 1.003 (*)    All other components within normal limits  CBC  CBG MONITORING, ED  TROPONIN I (HIGH SENSITIVITY)  TROPONIN I (HIGH SENSITIVITY)    EKG EKG Interpretation  Date/Time:  Monday April 03 2022 14:36:35 EDT Ventricular Rate:  75 PR Interval:  138 QRS Duration: 88 QT Interval:  386 QTC Calculation: 431 R Axis:   2 Text Interpretation: Normal sinus rhythm No previous ECGs available Confirmed by Lajean Saver [phone]) on 04/03/2022 5:39:26 PM  Radiology CT HEAD WO CONTRAST  Result Date: 04/03/2022 CLINICAL DATA:  Syncope episode. Unsure if head was hit during the fall. EXAM: CT HEAD WITHOUT CONTRAST CT CERVICAL SPINE WITHOUT CONTRAST TECHNIQUE: Multidetector CT imaging of the head and cervical spine was performed following the standard protocol without intravenous contrast. Multiplanar CT image reconstructions of the cervical spine were also generated. RADIATION DOSE REDUCTION: This exam was performed according to the departmental dose-optimization program which includes automated exposure control, adjustment of the mA and/or kV according to patient size and/or use of iterative reconstruction technique. COMPARISON:  None Available. FINDINGS: CT HEAD FINDINGS Brain: No evidence of acute infarction, hemorrhage, hydrocephalus, extra-axial collection or mass lesion/mass effect. Vascular: No hyperdense vessel or unexpected calcification. Skull: Normal. Negative for fracture or focal lesion. Sinuses/Orbits: No acute finding. Other: None. CT CERVICAL SPINE FINDINGS Alignment: Mild reversal of  normal cervical lordosis. Mild anterolisthesis of C3 and C4. Skull base and vertebrae: No acute fracture. No primary bone lesion or focal pathologic process. Soft tissues and spinal canal: No prevertebral fluid or swelling. No visible canal hematoma. Focal linear calcification of the posterior longitudinal ligament. Disc levels: Multilevel degenerate disc disease with disc height loss prominent at C5-C6 and C6-C7 with prominent posterior osteophytes. Multilevel facet joint arthropathy prominent at C3-C4, C4-C5. Mild bilateral neural foraminal stenosis at C5-C6 and C6-C7. Chondrocalcinosis Upper chest: Negative. Other: None IMPRESSION: CT head 1.  No acute intracranial abnormality. 2.  No calvarial fracture or significant soft tissue injury. CT cervical spine: 1. No acute fracture or traumatic subluxation. 2. Moderate multilevel degenerate disc disease prominent  at C5-C6 and C6-C7 with near complete loss of the disc height and osteophytes. Multilevel facet joint arthropathy and neural foraminal stenosis at C5-C6 and C6-C7. 3. Chondrocalcinosis and posterior longitudinal ligament focal linear calcification at the level of C1. Electronically Signed   By: Keane Police D.O.   On: 04/03/2022 13:58   CT Cervical Spine Wo Contrast  Result Date: 04/03/2022 CLINICAL DATA:  Syncope episode. Unsure if head was hit during the fall. EXAM: CT HEAD WITHOUT CONTRAST CT CERVICAL SPINE WITHOUT CONTRAST TECHNIQUE: Multidetector CT imaging of the head and cervical spine was performed following the standard protocol without intravenous contrast. Multiplanar CT image reconstructions of the cervical spine were also generated. RADIATION DOSE REDUCTION: This exam was performed according to the departmental dose-optimization program which includes automated exposure control, adjustment of the mA and/or kV according to patient size and/or use of iterative reconstruction technique. COMPARISON:  None Available. FINDINGS: CT HEAD FINDINGS  Brain: No evidence of acute infarction, hemorrhage, hydrocephalus, extra-axial collection or mass lesion/mass effect. Vascular: No hyperdense vessel or unexpected calcification. Skull: Normal. Negative for fracture or focal lesion. Sinuses/Orbits: No acute finding. Other: None. CT CERVICAL SPINE FINDINGS Alignment: Mild reversal of normal cervical lordosis. Mild anterolisthesis of C3 and C4. Skull base and vertebrae: No acute fracture. No primary bone lesion or focal pathologic process. Soft tissues and spinal canal: No prevertebral fluid or swelling. No visible canal hematoma. Focal linear calcification of the posterior longitudinal ligament. Disc levels: Multilevel degenerate disc disease with disc height loss prominent at C5-C6 and C6-C7 with prominent posterior osteophytes. Multilevel facet joint arthropathy prominent at C3-C4, C4-C5. Mild bilateral neural foraminal stenosis at C5-C6 and C6-C7. Chondrocalcinosis Upper chest: Negative. Other: None IMPRESSION: CT head 1.  No acute intracranial abnormality. 2.  No calvarial fracture or significant soft tissue injury. CT cervical spine: 1. No acute fracture or traumatic subluxation. 2. Moderate multilevel degenerate disc disease prominent at C5-C6 and C6-C7 with near complete loss of the disc height and osteophytes. Multilevel facet joint arthropathy and neural foraminal stenosis at C5-C6 and C6-C7. 3. Chondrocalcinosis and posterior longitudinal ligament focal linear calcification at the level of C1. Electronically Signed   By: Keane Police D.O.   On: 04/03/2022 13:58    Procedures Procedures  {Document cardiac monitor, telemetry assessment procedure when appropriate:1}  Medications Ordered in ED Medications - No data to display  ED Course/ Medical Decision Making/ A&P                           Medical Decision Making Amount and/or Complexity of Data Reviewed Labs: ordered. Radiology: ordered.   67 year old female presenting after syncopal episode  2 days ago and subsequent continued weakness. On arrival, patient has stable vital signs.  Physical exam reassuring.  No murmur.  Patient has a reassuring neurologic exam without any focal deficits.  She is able to ambulate without difficulty. Differential diagnosis includes dehydration, arrhythmia, vagal syncope, ACS, musculoskeletal pain, cervical strain, cervical fracture, CVA, electrolyte abnormalities, AKI. Labs reviewed and found to be reassuring.  No leukocytosis, anemia, AKI, or significant electrolyte abnormalities.  Initial troponin undetectable.  Urinalysis without signs of infection or ketones. Imaging reviewed and found to be reassuring.  CT head without signs of intracranial hemorrhage, mass, or large territory infarction.  CT cervical spine without signs of fracture or malalignment. EKG reviewed and reassuring, no signs of acute ischemia or arrhythmia. Work up not consistent with emergent process at this time. Discussed  heart rate monitor and suggested close PCP follow up for consideration for monitor and further referrals. Overall, concern for vasovagal syncope in the setting of significant time in the severe heat.  Low suspicion for ACS or CVA in the setting of reassuring CT head, nonischemic EKG, and undetectable troponin.  Patient will follow-up closely with her primary doctor.  Strict return precautions were given.   {Document critical care time when appropriate:1} {Document review of labs and clinical decision tools ie heart score, Chads2Vasc2 etc:1}  {Document your independent review of radiology images, and any outside records:1} {Document your discussion with family members, caretakers, and with consultants:1} {Document social determinants of health affecting pt's care:1} {Document your decision making why or why not admission, treatments were needed:1} Final Clinical Impression(s) / ED Diagnoses Final diagnoses:  Syncope and collapse    Rx / DC Orders ED Discharge Orders      None

## 2022-04-03 NOTE — Telephone Encounter (Signed)
Patient states she had an event on Saturday 8.26.23, she blacked out, son-in law was with her, felt like it was from dehydration  BP was checked 110/90 Sugar 110 BP 128/76 on 8.27.23, today 8.28.23 109/71  Works at [employer], is at work today, but states she doesn't feel right  Best to call on cell: [phone]   Transferred the patient to access nurse

## 2022-04-04 ENCOUNTER — Ambulatory Visit: Payer: No Typology Code available for payment source | Admitting: Family Medicine

## 2022-04-04 ENCOUNTER — Encounter: Payer: Self-pay | Admitting: Family Medicine

## 2022-04-04 VITALS — BP 100/60 | HR 81 | Temp 98.2°F | Ht 61.25 in | Wt 111.5 lb

## 2022-04-04 DIAGNOSIS — E871 Hypo-osmolality and hyponatremia: Secondary | ICD-10-CM | POA: Diagnosis not present

## 2022-04-04 DIAGNOSIS — R55 Syncope and collapse: Secondary | ICD-10-CM | POA: Diagnosis not present

## 2022-04-04 NOTE — Patient Instructions (Addendum)
Check BP pressure every few days Goal 90/60 to 140/90.  Keep up with regular fluids, heart healthy diet.  Call for referral to cardiology if as further recurrence  to set up  heart monitoring.

## 2022-04-04 NOTE — Assessment & Plan Note (Signed)
She will continue regular diet and adequate hydration.  We will reevaluate in 1 week with labs.

## 2022-04-04 NOTE — Progress Notes (Signed)
Patient ID: Christy Gallegos, female    DOB: August 15, 1955, 66 y.o.   MRN: [id number]  This visit was conducted in person.  BP 100/60   Pulse 81   Temp 98.2 F (36.8 C) (Oral)   Ht 5' 1.25" (1.556 m)   Wt 111 lb 8 oz (50.6 kg)   SpO2 98%   BMI 20.90 kg/m    CC:  Chief Complaint  Patient presents with   Follow-up    [place]-Syncope/Collapse    Subjective:   HPI: Christy Gallegos is a 66 y.o. female presenting on 04/04/2022 for Follow-up (-Syncope/Collapse)  Reviewed emergency room note from April 03, 2022 She was seen the following a syncopal event on August 26. It occurred after mowing the lawn outside in the heat for 3 hours.   BP was 110/90, glucose 110,  Pressure was unmeasured standing. Later BP 128/76 She felt heaviness in her bilateral upper and lower extremities, passed out but was caught by a relative.  No head injury. Labs unremarkable.  Troponin undetectable, UA negative CT head without signs of intracranial hemorrhage, mass or infarction.  CT cervical spine without sign of fracture. EKG unremarkable, no signs of acute ischemia or arrhythmia   She had adequate food and water that day. Discussed diagnosis likely secondary to vasovagal syncope.  Today she reports she is gradually feeling better.  No CP with exertion, SOB, palpations.   BP Readings from Last 3 Encounters:  04/04/22 100/60  04/03/22 130/78  10/07/21 120/70    No family history of heart issue  No passing out spells.     Wt Readings from Last 3 Encounters:  04/04/22 111 lb 8 oz (50.6 kg)  10/07/21 113 lb 1 oz (51.3 kg)  10/05/20 113 lb (51.3 kg)   Body mass index is 20.9 kg/m.   Relevant past medical, surgical, family and social history reviewed and updated as indicated. Interim medical history since our last visit reviewed. Allergies and medications reviewed and updated. Outpatient Medications Prior to Visit  Medication Sig Dispense Refill   alendronate (FOSAMAX) 70 MG tablet  TAKE 1 TABLET (70 MG TOTAL) BY MOUTH EVERY 7 DAYS WITH FULL GLASS WATER ON EMPTY STOMACH 4 tablet 2   Calcium Carbonate-Vitamin D 600-400 MG-UNIT tablet Take 1 tablet by mouth daily.     Cholecalciferol (VITAMIN D3) 50 MCG (2000 UT) CHEW Chew 1 tablet by mouth daily.     fish oil-omega-3 fatty acids 1000 MG capsule Take 1 g by mouth 2 (two) times daily.     L-Lysine 500 MG CAPS Take 1 capsule by mouth daily.     loratadine (CLARITIN) 10 MG tablet Take 10 mg by mouth daily.     Multiple Vitamins-Minerals (CENTRUM SILVER) tablet Take 1 tablet by mouth daily.     vitamin C (ASCORBIC ACID) 500 MG tablet Take 500 mg by mouth daily.     vitamin E 400 UNIT capsule Take 400 Units by mouth daily.     No facility-administered medications prior to visit.     Per HPI unless specifically indicated in ROS section below Review of Systems  Constitutional:  Negative for fatigue and fever.  HENT:  Negative for congestion.   Eyes:  Negative for pain.  Respiratory:  Negative for cough and shortness of breath.   Cardiovascular:  Negative for chest pain, palpitations and leg swelling.  Gastrointestinal:  Negative for abdominal pain.  Genitourinary:  Negative for dysuria and vaginal bleeding.  Musculoskeletal:  Negative  for back pain.  Neurological:  Negative for syncope, light-headedness and headaches.  Psychiatric/Behavioral:  Negative for dysphoric mood.    Objective:  BP 100/60   Pulse 81   Temp 98.2 F (36.8 C) (Oral)   Ht 5' 1.25" (1.556 m)   Wt 111 lb 8 oz (50.6 kg)   SpO2 98%   BMI 20.90 kg/m   Wt Readings from Last 3 Encounters:  04/04/22 111 lb 8 oz (50.6 kg)  10/07/21 113 lb 1 oz (51.3 kg)  10/05/20 113 lb (51.3 kg)      Physical Exam Constitutional:      General: She is not in acute distress.    Appearance: Normal appearance. She is well-developed. She is not ill-appearing or toxic-appearing.  HENT:     Head: Normocephalic.     Right Ear: Hearing, tympanic membrane, ear canal and  external ear normal. Tympanic membrane is not erythematous, retracted or bulging.     Left Ear: Hearing, tympanic membrane, ear canal and external ear normal. Tympanic membrane is not erythematous, retracted or bulging.     Nose: No mucosal edema or rhinorrhea.     Right Sinus: No maxillary sinus tenderness or frontal sinus tenderness.     Left Sinus: No maxillary sinus tenderness or frontal sinus tenderness.     Mouth/Throat:     Pharynx: Uvula midline.  Eyes:     General: Lids are normal. Lids are everted, no foreign bodies appreciated.     Conjunctiva/sclera: Conjunctivae normal.     Pupils: Pupils are equal, round, and reactive to light.  Neck:     Thyroid: No thyroid mass or thyromegaly.     Vascular: No carotid bruit.     Trachea: Trachea normal.  Cardiovascular:     Rate and Rhythm: Normal rate and regular rhythm.     Pulses: Normal pulses.     Heart sounds: Normal heart sounds, S1 normal and S2 normal. No murmur heard.    No friction rub. No gallop.  Pulmonary:     Effort: Pulmonary effort is normal. No tachypnea or respiratory distress.     Breath sounds: Normal breath sounds. No decreased breath sounds, wheezing, rhonchi or rales.  Abdominal:     General: Bowel sounds are normal.     Palpations: Abdomen is soft.     Tenderness: There is no abdominal tenderness.  Musculoskeletal:     Cervical back: Normal range of motion and neck supple.  Skin:    General: Skin is warm and dry.     Findings: No rash.  Neurological:     Mental Status: She is alert.  Psychiatric:        Mood and Affect: Mood is not anxious or depressed.        Speech: Speech normal.        Behavior: Behavior normal. Behavior is cooperative.        Thought Content: Thought content normal.        Judgment: Judgment normal.       Results for orders placed or performed during the hospital encounter of 24/40/10  Basic metabolic panel  Result Value Ref Range   Sodium 134 (L) 135 - 145 mmol/L    Potassium 5.0 3.5 - 5.1 mmol/L   Chloride 101 98 - 111 mmol/L   CO2 25 22 - 32 mmol/L   Glucose, Bld 109 (H) 70 - 99 mg/dL   BUN 7 (L) 8 - 23 mg/dL   Creatinine, Ser 0.76 0.44 - 1.00  mg/dL   Calcium 9.2 8.9 - 10.3 mg/dL   GFR, Estimated >60 >60 mL/min   Anion gap 8 5 - 15  CBC  Result Value Ref Range   WBC 8.0 4.0 - 10.5 K/uL   RBC 4.56 3.87 - 5.11 MIL/uL   Hemoglobin 14.6 12.0 - 15.0 g/dL   HCT 41.8 36.0 - 46.0 %   MCV 91.7 80.0 - 100.0 fL   MCH 32.0 26.0 - 34.0 pg   MCHC 34.9 30.0 - 36.0 g/dL   RDW 11.8 11.5 - 15.5 %   Platelets 275 150 - 400 K/uL   nRBC 0.0 0.0 - 0.2 %  Urinalysis, Routine w reflex microscopic Urine, Clean Catch  Result Value Ref Range   Color, Urine STRAW (A) YELLOW   APPearance CLEAR CLEAR   Specific Gravity, Urine 1.003 (L) 1.005 - 1.030   pH 6.0 5.0 - 8.0   Glucose, UA NEGATIVE NEGATIVE mg/dL   Hgb urine dipstick NEGATIVE NEGATIVE   Bilirubin Urine NEGATIVE NEGATIVE   Ketones, ur NEGATIVE NEGATIVE mg/dL   Protein, ur NEGATIVE NEGATIVE mg/dL   Nitrite NEGATIVE NEGATIVE   Leukocytes,Ua NEGATIVE NEGATIVE  Troponin I (High Sensitivity)  Result Value Ref Range   Troponin I (High Sensitivity) <2 <18 ng/L     COVID 19 screen:  No recent travel or known exposure to COVID19 The patient denies respiratory symptoms of COVID 19 at this time. The importance of social distancing was discussed today.   Assessment and Plan    Problem List Items Addressed This Visit     Hyponatremia    She will continue regular diet and adequate hydration.  We will reevaluate in 1 week with labs.      Syncope - Primary    Given history, syncopal event most likely due to overheating, orthostasis and vasovagal event. She will work on following blood pressure at home, increasing fluids being mindful about regular diet and avoiding working out in the yard in the intense heat.  If she has continued or recurrent symptoms we will refer to cardiology for Holter monitor.         Eliezer Lofts, MD

## 2022-04-04 NOTE — Assessment & Plan Note (Signed)
Given history, syncopal event most likely due to overheating, orthostasis and vasovagal event. She will work on following blood pressure at home, increasing fluids being mindful about regular diet and avoiding working out in the yard in the intense heat.  If she has continued or recurrent symptoms we will refer to cardiology for Holter monitor.

## 2022-04-06 ENCOUNTER — Telehealth: Payer: Self-pay | Admitting: Family Medicine

## 2022-04-06 NOTE — Telephone Encounter (Signed)
Call I see no clear indication to move forward with an echocardiogram given she has no shortness of breath or peripheral swelling.  If she continues to notice palpitations or lightheadedness the next step would be referral to cardiology for a monitor.

## 2022-04-06 NOTE — Telephone Encounter (Signed)
Pt said when she was seen on 04/04/22 pt forgot to ask Dr Diona Browner if having an echo cardiogram would be beneficial or not. Pt said she is not asking for a cardiology referral but wondered if an echo might give more info about what happened to pt when she passed out.pt said today she is not feeling bad or in any distress. No CP,SOB, H/A or dizziness. Pt said her heart is not racing but pt does not quite feel "normal". 04/06/22 when first got up this morning BP 113/59 P 95 and again today at 12:15 BP 144/74 P 84. Pt has had a usual day at work and is just feeling a little anxious about what caused pt to pass out.pt is going to drink plenty of fluids, watch her diet and stay out of heat as much as possible. Pt will monitor BP and report after having blood test next wk. Pt request cb after Dr Diona Browner reviews note.Sending note to Dr Diona Browner and Butch Penny CMA.

## 2022-04-06 NOTE — Telephone Encounter (Signed)
Pt called & stated she had some questions about her recent follow up appt. Call back # is [phone].

## 2022-04-07 NOTE — Telephone Encounter (Signed)
Debbie notified as instructed by telephone.  Patient states understanding.

## 2022-04-11 ENCOUNTER — Other Ambulatory Visit: Payer: Self-pay | Admitting: Family Medicine

## 2022-04-13 NOTE — Telephone Encounter (Signed)
Pt called in stated she fax over her lab results for PCP review . Requesting a call back [phone]

## 2022-04-13 NOTE — Telephone Encounter (Signed)
Lab results are in Dr. Rometta Emery office in box for review.

## 2022-04-14 NOTE — Telephone Encounter (Signed)
Call  Reviewed labs they are in normal range.. sodium normal.

## 2022-04-17 NOTE — Telephone Encounter (Signed)
Left message for Christy Gallegos that Dr. Diona Browner reviewed her labs and her labs level were in normal range including her sodium.  I ask that she call us back if she has any questions.

## 2022-06-26 ENCOUNTER — Other Ambulatory Visit: Payer: Self-pay | Admitting: Oral Surgery

## 2022-09-08 ENCOUNTER — Other Ambulatory Visit: Payer: Self-pay | Admitting: Family Medicine

## 2022-09-14 ENCOUNTER — Ambulatory Visit
Admission: RE | Admit: 2022-09-14 | Discharge: 2022-09-14 | Disposition: A | Discharge status: Home / Self Care | Payer: No Typology Code available for payment source | Source: Ambulatory Visit | Attending: Family Medicine | Admitting: Family Medicine

## 2022-09-14 DIAGNOSIS — M810 Age-related osteoporosis without current pathological fracture: Secondary | ICD-10-CM

## 2022-09-15 ENCOUNTER — Telehealth: Payer: Self-pay | Admitting: Family Medicine

## 2022-09-15 NOTE — Telephone Encounter (Signed)
Patient is scheduled to come in for her cpe on 10/12/22,she would like to know if her lab orders could be faxed to her,because she can have her labs drawn at her job? Fx [phone]

## 2022-09-19 ENCOUNTER — Other Ambulatory Visit: Payer: Self-pay | Admitting: Family Medicine

## 2022-09-19 DIAGNOSIS — Z1231 Encounter for screening mammogram for malignant neoplasm of breast: Secondary | ICD-10-CM

## 2022-09-19 NOTE — Telephone Encounter (Signed)
Prescription for labs is in my outbox

## 2022-09-20 NOTE — Telephone Encounter (Addendum)
Lab orders faxed to Ms. Appling at [phone].

## 2022-10-12 ENCOUNTER — Encounter: Payer: Self-pay | Admitting: Family Medicine

## 2022-10-12 ENCOUNTER — Ambulatory Visit (INDEPENDENT_AMBULATORY_CARE_PROVIDER_SITE_OTHER): Payer: No Typology Code available for payment source | Admitting: Family Medicine

## 2022-10-12 VITALS — BP 124/80 | HR 75 | Temp 97.5°F | Ht 61.25 in | Wt 116.0 lb

## 2022-10-12 DIAGNOSIS — E7800 Pure hypercholesterolemia, unspecified: Secondary | ICD-10-CM

## 2022-10-12 DIAGNOSIS — M810 Age-related osteoporosis without current pathological fracture: Secondary | ICD-10-CM | POA: Diagnosis not present

## 2022-10-12 DIAGNOSIS — M25531 Pain in right wrist: Secondary | ICD-10-CM | POA: Insufficient documentation

## 2022-10-12 DIAGNOSIS — Z1211 Encounter for screening for malignant neoplasm of colon: Secondary | ICD-10-CM | POA: Diagnosis not present

## 2022-10-12 DIAGNOSIS — Z0000 Encounter for general adult medical examination without abnormal findings: Secondary | ICD-10-CM

## 2022-10-12 NOTE — Assessment & Plan Note (Signed)
Chronic, stable on no medication. Continue working on heart healthy lifestyle.

## 2022-10-12 NOTE — Progress Notes (Signed)
Patient ID: Christy Gallegos, female    DOB: [birth year], 67 y.o.   MRN: [id number]  This visit was conducted in person.  BP 124/80 (BP Location: Left Arm, Patient Position: Sitting, Cuff Size: Normal)   Pulse 75   Temp (!) 97.5 F (36.4 C)   Ht 5' 1.25" (1.556 m)   Wt 116 lb (52.6 kg)   SpO2 100%   BMI 21.74 kg/m    CC:  Chief Complaint  Patient presents with   Annual Exam    Subjective:   HPI: Christy Gallegos is a 67 y.o. female presenting on 10/12/2022 for Annual Exam   The patient presents  for  complete physical and review of chronic health problems. He/She also has the following acute concerns today: right wrist pain.4  Uses thumb repetitively at work.  Numbness in hand.  No falls.   No further syncopal events.  Reviewed outside labs  Elevated Cholesterol:   Outside labs  LDL 117 down from 132  HDL 88  TG 64  Total chol 218 Using medications without problems: Muscle aches:  Diet compliance good Exercise: walking daily Other complaints:  Vit D: low 28.... on vit D 3 2000 daily.    Glucose 96  Nml liver and kidney function.  Relevant past medical, surgical, family and social history reviewed and updated as indicated. Interim medical history since our last visit reviewed. Allergies and medications reviewed and updated. Outpatient Medications Prior to Visit  Medication Sig Dispense Refill   alendronate (FOSAMAX) 70 MG tablet TAKE 1 TABLET BY MOUTH EVERY 7 DAYS WITH FULL GLASS OF WATER ON AN EMPTY STOMACH 4 tablet 1   Calcium Carbonate-Vitamin D 600-400 MG-UNIT tablet Take 1 tablet by mouth daily.     Cholecalciferol (VITAMIN D3) 50 MCG (2000 UT) CHEW Chew 1 tablet by mouth daily.     fish oil-omega-3 fatty acids 1000 MG capsule Take 1 g by mouth 2 (two) times daily.     L-Lysine 500 MG CAPS Take 1 capsule by mouth daily.     loratadine (CLARITIN) 10 MG tablet Take 10 mg by mouth daily.     Multiple Vitamins-Minerals (CENTRUM SILVER) tablet Take 1 tablet by  mouth daily.     vitamin C (ASCORBIC ACID) 500 MG tablet Take 500 mg by mouth daily.     vitamin E 400 UNIT capsule Take 400 Units by mouth daily.     No facility-administered medications prior to visit.     Per HPI unless specifically indicated in ROS section below Review of Systems  Constitutional:  Negative for fatigue and fever.  HENT:  Negative for congestion.   Eyes:  Negative for pain.  Respiratory:  Negative for cough and shortness of breath.   Cardiovascular:  Negative for chest pain, palpitations and leg swelling.  Gastrointestinal:  Negative for abdominal pain.  Genitourinary:  Negative for dysuria and vaginal bleeding.  Musculoskeletal:  Negative for back pain.  Neurological:  Negative for syncope, light-headedness and headaches.  Psychiatric/Behavioral:  Negative for dysphoric mood.    Objective:  BP 124/80 (BP Location: Left Arm, Patient Position: Sitting, Cuff Size: Normal)   Pulse 75   Temp (!) 97.5 F (36.4 C)   Ht 5' 1.25" (1.556 m)   Wt 116 lb (52.6 kg)   SpO2 100%   BMI 21.74 kg/m   Wt Readings from Last 3 Encounters:  10/12/22 116 lb (52.6 kg)  04/04/22 111 lb 8 oz (50.6 kg)  10/07/21 113 lb  1 oz (51.3 kg)      Physical Exam Vitals and nursing note reviewed.  Constitutional:      General: She is not in acute distress.    Appearance: Normal appearance. She is well-developed. She is not ill-appearing or toxic-appearing.  HENT:     Head: Normocephalic.     Right Ear: Hearing, tympanic membrane, ear canal and external ear normal.     Left Ear: Hearing, tympanic membrane, ear canal and external ear normal.     Nose: Nose normal.  Eyes:     General: Lids are normal. Lids are everted, no foreign bodies appreciated.     Conjunctiva/sclera: Conjunctivae normal.     Pupils: Pupils are equal, round, and reactive to light.  Neck:     Thyroid: No thyroid mass or thyromegaly.     Vascular: No carotid bruit.     Trachea: Trachea normal.  Cardiovascular:      Rate and Rhythm: Normal rate and regular rhythm.     Heart sounds: Normal heart sounds, S1 normal and S2 normal. No murmur heard.    No gallop.  Pulmonary:     Effort: Pulmonary effort is normal. No respiratory distress.     Breath sounds: Normal breath sounds. No wheezing, rhonchi or rales.  Abdominal:     General: Bowel sounds are normal. There is no distension or abdominal bruit.     Palpations: Abdomen is soft. There is no fluid wave or mass.     Tenderness: There is no abdominal tenderness. There is no guarding or rebound.     Hernia: No hernia is present.  Musculoskeletal:     Cervical back: Normal range of motion and neck supple.  Lymphadenopathy:     Cervical: No cervical adenopathy.  Skin:    General: Skin is warm and dry.     Findings: No rash.     Comments: Bruising at site of blood draw, no hematoma no signs and symptoms of infection  Neurological:     Mental Status: She is alert.     Cranial Nerves: No cranial nerve deficit.     Sensory: No sensory deficit.  Psychiatric:        Mood and Affect: Mood is not anxious or depressed.        Speech: Speech normal.        Behavior: Behavior normal. Behavior is cooperative.        Judgment: Judgment normal.       Results for orders placed or performed during the hospital encounter of [id number]  Basic metabolic panel  Result Value Ref Range   Sodium 134 (L) 135 - 145 mmol/L   Potassium 5.0 3.5 - 5.1 mmol/L   Chloride 101 98 - 111 mmol/L   CO2 25 22 - 32 mmol/L   Glucose, Bld 109 (H) 70 - 99 mg/dL   BUN 7 (L) 8 - 23 mg/dL   Creatinine, Ser 0.76 0.44 - 1.00 mg/dL   Calcium 9.2 8.9 - 10.3 mg/dL   GFR, Estimated >60 >60 mL/min   Anion gap 8 5 - 15  CBC  Result Value Ref Range   WBC 8.0 4.0 - 10.5 K/uL   RBC 4.56 3.87 - 5.11 MIL/uL   Hemoglobin 14.6 12.0 - 15.0 g/dL   HCT 41.8 36.0 - 46.0 %   MCV 91.7 80.0 - 100.0 fL   MCH 32.0 26.0 - 34.0 pg   MCHC 34.9 30.0 - 36.0 g/dL   RDW 11.8 11.5 - 15.5 %  Platelets 275 150  - 400 K/uL   nRBC 0.0 0.0 - 0.2 %  Urinalysis, Routine w reflex microscopic Urine, Clean Catch  Result Value Ref Range   Color, Urine STRAW (A) YELLOW   APPearance CLEAR CLEAR   Specific Gravity, Urine 1.003 (L) 1.005 - 1.030   pH 6.0 5.0 - 8.0   Glucose, UA NEGATIVE NEGATIVE mg/dL   Hgb urine dipstick NEGATIVE NEGATIVE   Bilirubin Urine NEGATIVE NEGATIVE   Ketones, ur NEGATIVE NEGATIVE mg/dL   Protein, ur NEGATIVE NEGATIVE mg/dL   Nitrite NEGATIVE NEGATIVE   Leukocytes,Ua NEGATIVE NEGATIVE  Troponin I (High Sensitivity)  Result Value Ref Range   Troponin I (High Sensitivity) <2 <18 ng/L    This visit occurred during the SARS-CoV-2 public health emergency.  Safety protocols were in place, including screening questions prior to the visit, additional usage of staff PPE, and extensive cleaning of exam room while observing appropriate contact time as indicated for disinfecting solutions.   COVID 19 screen:  No recent travel or known exposure to COVID19 The patient denies respiratory symptoms of COVID 19 at this time. The importance of social distancing was discussed today.   Assessment and Plan The patient's preventative maintenance and recommended screening tests for an annual wellness exam were reviewed in full today. Brought up to date unless services declined.  Counselled on the importance of diet, exercise, and its role in overall health and mortality. The patient's FH and SH was reviewed, including their home life, tobacco status, and drug and alcohol status.   Vaccines: Uptodate with td, COVID19  X 3, flu 05/2021, shingrix Pap/DVE:  09/2019, no further indicated, but will continue DVE exams every 1-2 years per pt request. Mammo:  11/2021, scheduled Bone Density: 03/2020, worsening control at last check. Back on fosamax since 04/2020  No jaw pain. Repeated on 09/2022 T-2.6 improved Colon:  2007 normal, plan ifob yearly Smoking Status: none ETOH/ drug use: none/none  Hep C:    done, negative  HIV screen:   refused.   Problem List Items Addressed This Visit     Acute pain of right wrist    Acute, likely multifactorial in part due to from osteoarthritis of MCP joint as well as likely carpal tunnel.  Will treat with topical Voltaren gel and bedtime carpal tunnel brace follow-up in 4 to 6 weeks if not improving as expected.      High cholesterol    Chronic, stable on no medication. Continue working on heart healthy lifestyle.      Osteoporosis    Chronic, improved back on fosamax.  Has been on Fosamax since 2021.      Other Visit Diagnoses     Routine general medical examination at a health care facility    -  Primary   Colon cancer screening       Relevant Orders   Fecal occult blood, imunochemical       Eliezer Lofts, MD

## 2022-10-12 NOTE — Assessment & Plan Note (Signed)
Acute, likely multifactorial in part due to from osteoarthritis of MCP joint as well as likely carpal tunnel.  Will treat with topical Voltaren gel and bedtime carpal tunnel brace follow-up in 4 to 6 weeks if not improving as expected.

## 2022-10-12 NOTE — Assessment & Plan Note (Signed)
Chronic, improved back on fosamax.  Has been on Fosamax since 2021.

## 2022-10-12 NOTE — Patient Instructions (Addendum)
Start Voltaren gel 4 times daily to base on thumb.  Start carpal tunnel brace at night for 2-4 weeks.

## 2022-10-24 ENCOUNTER — Other Ambulatory Visit: Payer: Self-pay | Admitting: Family Medicine

## 2022-11-20 ENCOUNTER — Ambulatory Visit
Admission: RE | Admit: 2022-11-20 | Discharge: 2022-11-20 | Disposition: A | Discharge status: Home / Self Care | Payer: No Typology Code available for payment source | Source: Ambulatory Visit | Attending: Family Medicine | Admitting: Family Medicine

## 2022-11-20 DIAGNOSIS — Z1231 Encounter for screening mammogram for malignant neoplasm of breast: Secondary | ICD-10-CM

## 2022-12-07 ENCOUNTER — Telehealth: Payer: Self-pay | Admitting: Family Medicine

## 2022-12-07 NOTE — Telephone Encounter (Signed)
Patient called in and stated that she had her shingles shot on April 12 and wanted to know when she should get her next one. Informed patient that the second one is usually 3-6 months after the first but she stated that her PCP should tell her exactly when. Please advise. Thank you!

## 2022-12-07 NOTE — Telephone Encounter (Signed)
That is correct!  I typically say repeat in 2 to 6 months following the first dose. Please let the patient know.  CC: Christy Gallegos as Lorain Childes Thank you, Christy Gallegos!

## 2022-12-08 NOTE — Telephone Encounter (Signed)
Pt notified of Dr. Daphine Deutscher comments

## 2023-03-01 ENCOUNTER — Other Ambulatory Visit: Payer: Self-pay | Admitting: Family Medicine

## 2023-03-01 DIAGNOSIS — Z1231 Encounter for screening mammogram for malignant neoplasm of breast: Secondary | ICD-10-CM

## 2023-03-15 ENCOUNTER — Telehealth: Payer: Self-pay | Admitting: Family Medicine

## 2023-03-15 NOTE — Telephone Encounter (Signed)
Patient contacted the office regarding medication alendronate (FOSAMAX) 70 MG tablet , patient is asking for future refills to [facility] [place]. [facility] patient's preferred [facility] list.

## 2023-03-15 NOTE — Telephone Encounter (Signed)
Noted  

## 2023-06-06 DIAGNOSIS — D485 Neoplasm of uncertain behavior of skin: Secondary | ICD-10-CM | POA: Diagnosis not present

## 2023-06-06 DIAGNOSIS — Z85828 Personal history of other malignant neoplasm of skin: Secondary | ICD-10-CM | POA: Diagnosis not present

## 2023-06-06 DIAGNOSIS — L819 Disorder of pigmentation, unspecified: Secondary | ICD-10-CM | POA: Diagnosis not present

## 2023-06-06 DIAGNOSIS — L570 Actinic keratosis: Secondary | ICD-10-CM | POA: Diagnosis not present

## 2023-06-06 DIAGNOSIS — L821 Other seborrheic keratosis: Secondary | ICD-10-CM | POA: Diagnosis not present

## 2023-06-06 DIAGNOSIS — L814 Other melanin hyperpigmentation: Secondary | ICD-10-CM | POA: Diagnosis not present

## 2023-06-06 DIAGNOSIS — D225 Melanocytic nevi of trunk: Secondary | ICD-10-CM | POA: Diagnosis not present

## 2023-06-06 DIAGNOSIS — C44712 Basal cell carcinoma of skin of right lower limb, including hip: Secondary | ICD-10-CM | POA: Diagnosis not present

## 2023-09-27 ENCOUNTER — Other Ambulatory Visit: Payer: Self-pay | Admitting: Family Medicine

## 2023-10-01 ENCOUNTER — Telehealth: Payer: Self-pay | Admitting: *Deleted

## 2023-10-01 DIAGNOSIS — E7800 Pure hypercholesterolemia, unspecified: Secondary | ICD-10-CM

## 2023-10-01 NOTE — Telephone Encounter (Signed)
-----   Message from Lovena Neighbours sent at 10/01/2023  9:52 AM EST ----- Regarding: Fasting labs for Monday 3.10.25 Please put physical lab orders in future. Thank you, Denny Peon

## 2023-10-09 ENCOUNTER — Other Ambulatory Visit: Payer: Self-pay

## 2023-10-10 ENCOUNTER — Other Ambulatory Visit (INDEPENDENT_AMBULATORY_CARE_PROVIDER_SITE_OTHER): Payer: Self-pay

## 2023-10-10 ENCOUNTER — Encounter: Payer: Self-pay | Admitting: Family Medicine

## 2023-10-10 DIAGNOSIS — E7800 Pure hypercholesterolemia, unspecified: Secondary | ICD-10-CM

## 2023-10-10 LAB — COMPREHENSIVE METABOLIC PANEL
ALT: 24 U/L (ref 0–35)
AST: 22 U/L (ref 0–37)
Albumin: 4.3 g/dL (ref 3.5–5.2)
Alkaline Phosphatase: 46 U/L (ref 39–117)
BUN: 15 mg/dL (ref 6–23)
CO2: 30 meq/L (ref 19–32)
Calcium: 9.7 mg/dL (ref 8.4–10.5)
Chloride: 104 meq/L (ref 96–112)
Creatinine, Ser: 0.87 mg/dL (ref 0.40–1.20)
GFR: 68.74 mL/min (ref >60.00)
Glucose, Bld: 93 mg/dL (ref 70–99)
Potassium: 4.3 meq/L (ref 3.5–5.1)
Sodium: 139 meq/L (ref 135–145)
Total Bilirubin: 1 mg/dL (ref 0.2–1.2)
Total Protein: 7 g/dL (ref 6.0–8.3)

## 2023-10-10 LAB — LIPID PANEL
Cholesterol: 234 mg/dL — ABNORMAL HIGH (ref 0–200)
HDL: 69.8 mg/dL (ref >39.00)
LDL Cholesterol: 146 mg/dL — ABNORMAL HIGH (ref 0–99)
NonHDL: 164.58
Total CHOL/HDL Ratio: 3
Triglycerides: 93 mg/dL (ref 0.0–149.0)
VLDL: 18.6 mg/dL (ref 0.0–40.0)

## 2023-10-10 NOTE — Progress Notes (Signed)
 No critical labs need to be addressed urgently. We will discuss labs in detail at upcoming office visit.

## 2023-10-15 ENCOUNTER — Other Ambulatory Visit: Payer: Self-pay

## 2023-10-16 ENCOUNTER — Encounter: Payer: Self-pay | Admitting: Family Medicine

## 2023-10-16 ENCOUNTER — Ambulatory Visit (INDEPENDENT_AMBULATORY_CARE_PROVIDER_SITE_OTHER): Payer: No Typology Code available for payment source | Admitting: Family Medicine

## 2023-10-16 ENCOUNTER — Other Ambulatory Visit: Payer: Self-pay

## 2023-10-16 VITALS — BP 100/60 | HR 87 | Temp 97.9°F | Ht 61.25 in | Wt 123.1 lb

## 2023-10-16 DIAGNOSIS — E7800 Pure hypercholesterolemia, unspecified: Secondary | ICD-10-CM

## 2023-10-16 DIAGNOSIS — Z1211 Encounter for screening for malignant neoplasm of colon: Secondary | ICD-10-CM

## 2023-10-16 DIAGNOSIS — M810 Age-related osteoporosis without current pathological fracture: Secondary | ICD-10-CM

## 2023-10-16 DIAGNOSIS — Z0000 Encounter for general adult medical examination without abnormal findings: Secondary | ICD-10-CM | POA: Diagnosis not present

## 2023-10-16 DIAGNOSIS — H900 Conductive hearing loss, bilateral: Secondary | ICD-10-CM | POA: Insufficient documentation

## 2023-10-16 NOTE — Progress Notes (Signed)
 Patient ID: Christy Gallegos, female    DOB: [birth year], 68 y.o.   MRN: [id number]  This visit was conducted in person.  BP 100/60 (BP Location: Left Arm, Patient Position: Sitting, Cuff Size: Normal)   Pulse 87   Temp 97.9 F (36.6 C) (Temporal)   Ht 5' 1.25" (1.556 m)   Wt 123 lb 2 oz (55.8 kg)   SpO2 98%   BMI 23.07 kg/m    CC:  Chief Complaint  Patient presents with   Medicare Wellness    Subjective:   HPI: Christy Gallegos is a 68 y.o. female presenting on 10/16/2023 for Medicare Wellness   The patient presents  for   annual medicare wellness, complete physical and review of chronic health problems. He/She also has the following acute concerns today:   right lateral hip pain ongoing for 1 year.  History of  low back pain remotely. S/P lumbar fusion.  Has noted intermitten hearing loss in B ears left greater than right.  In barrel at times.  I have personally reviewed the Medicare Annual Wellness questionnaire and have noted 1. The patient's medical and social history 2. Their use of alcohol, tobacco or illicit drugs 3. Their current medications and supplements 4. The patient's functional ability including ADL's, fall risks, home safety risks and hearing or visual             impairment. 5. Diet and physical activities 6. Evidence for depression or mood disorders 7.         Updated provider list Cognitive evaluation was performed and recorded on pt medicare questionnaire form. The patients weight, height, BMI and visual acuity have been recorded in the chart   I have made referrals, counseling and provided education to the patient based review of the above and I have provided the pt with a written personalized care plan for preventive services.  .  Documentation of this information was scanned into the electronic record under the media tab.  Hearing Screening  Method: Audiometry   500Hz  1000Hz  2000Hz  4000Hz   Right ear 0 0 20 20  Left ear 0 0 20 0   Vision  Screening   Right eye Left eye Both eyes  Without correction 20/20 20/40 2020  With correction        Elevated Cholesterol:  Lab Results  Component Value Date   CHOL 234 (H) 10/10/2023   HDL 69.80 10/10/2023   LDLCALC 146 (H) 10/10/2023   TRIG 93.0 10/10/2023   CHOLHDL 3 10/10/2023  The 10-year ASCVD risk score (Arnett DK, et al., 2019) is: 4.3%   Values used to calculate the score:     Age: 42 years     Sex: Female     Is Non-Hispanic African American: No     Diabetic: No     Tobacco smoker: No     Systolic Blood Pressure: 100 mmHg     Is BP treated: No     HDL Cholesterol: 69.8 mg/dL     Total Cholesterol: 234 mg/dL Using medications without problems: Muscle aches:  Diet compliance  moderate Exercise: walking daily Other complaints:   Retired  in last year. Wt Readings from Last 3 Encounters:  10/16/23 123 lb 2 oz (55.8 kg)  10/12/22 116 lb (52.6 kg)  04/04/22 111 lb 8 oz (50.6 kg)     Relevant past medical, surgical, family and social history reviewed and updated as indicated. Interim medical history since our last visit reviewed. Allergies and  medications reviewed and updated. Outpatient Medications Prior to Visit  Medication Sig Dispense Refill   alendronate (FOSAMAX) 70 MG tablet TAKE 1 TABLET BY MOUTH EVERY 7 DAYS WITH FULL GLASS OF WATER ON AN EMPTY STOMACH 4 tablet 0   Calcium Carbonate-Vitamin D 600-400 MG-UNIT tablet Take 1 tablet by mouth daily.     Cholecalciferol (VITAMIN D3) 50 MCG (2000 UT) CHEW Chew 1 tablet by mouth daily.     fish oil-omega-3 fatty acids 1000 MG capsule Take 1 g by mouth 2 (two) times daily.     L-Lysine 500 MG CAPS Take 1 capsule by mouth daily.     loratadine (CLARITIN) 10 MG tablet Take 10 mg by mouth daily.     Multiple Vitamins-Minerals (CENTRUM SILVER) tablet Take 1 tablet by mouth daily.     vitamin C (ASCORBIC ACID) 500 MG tablet Take 500 mg by mouth daily.     vitamin E 400 UNIT capsule Take 400 Units by mouth daily.      No facility-administered medications prior to visit.     Per HPI unless specifically indicated in ROS section below Review of Systems  Constitutional:  Negative for fatigue and fever.  HENT:  Negative for congestion.   Eyes:  Negative for pain.  Respiratory:  Negative for cough and shortness of breath.   Cardiovascular:  Negative for chest pain, palpitations and leg swelling.  Gastrointestinal:  Negative for abdominal pain.  Genitourinary:  Negative for dysuria and vaginal bleeding.  Musculoskeletal:  Negative for back pain.  Neurological:  Negative for syncope, light-headedness and headaches.  Psychiatric/Behavioral:  Negative for dysphoric mood.    Objective:  BP 100/60 (BP Location: Left Arm, Patient Position: Sitting, Cuff Size: Normal)   Pulse 87   Temp 97.9 F (36.6 C) (Temporal)   Ht 5' 1.25" (1.556 m)   Wt 123 lb 2 oz (55.8 kg)   SpO2 98%   BMI 23.07 kg/m   Wt Readings from Last 3 Encounters:  10/16/23 123 lb 2 oz (55.8 kg)  10/12/22 116 lb (52.6 kg)  04/04/22 111 lb 8 oz (50.6 kg)      Physical Exam Vitals and nursing note reviewed.  Constitutional:      General: She is not in acute distress.    Appearance: Normal appearance. She is well-developed. She is not ill-appearing or toxic-appearing.  HENT:     Head: Normocephalic.     Right Ear: Hearing, tympanic membrane, ear canal and external ear normal.     Left Ear: Hearing, tympanic membrane, ear canal and external ear normal.     Nose: Nose normal.  Eyes:     General: Lids are normal. Lids are everted, no foreign bodies appreciated.     Conjunctiva/sclera: Conjunctivae normal.     Pupils: Pupils are equal, round, and reactive to light.  Neck:     Thyroid: No thyroid mass or thyromegaly.     Vascular: No carotid bruit.     Trachea: Trachea normal.  Cardiovascular:     Rate and Rhythm: Normal rate and regular rhythm.     Heart sounds: Normal heart sounds, S1 normal and S2 normal. No murmur heard.    No  gallop.  Pulmonary:     Effort: Pulmonary effort is normal. No respiratory distress.     Breath sounds: Normal breath sounds. No wheezing, rhonchi or rales.  Abdominal:     General: Bowel sounds are normal. There is no distension or abdominal bruit.     Palpations:  Abdomen is soft. There is no fluid wave or mass.     Tenderness: There is no abdominal tenderness. There is no guarding or rebound.     Hernia: No hernia is present.  Musculoskeletal:     Cervical back: Normal range of motion and neck supple.  Lymphadenopathy:     Cervical: No cervical adenopathy.  Skin:    General: Skin is warm and dry.     Findings: No rash.     Comments: Bruising at site of blood draw, no hematoma no signs and symptoms of infection  Neurological:     Mental Status: She is alert.     Cranial Nerves: No cranial nerve deficit.     Sensory: No sensory deficit.  Psychiatric:        Mood and Affect: Mood is not anxious or depressed.        Speech: Speech normal.        Behavior: Behavior normal. Behavior is cooperative.        Judgment: Judgment normal.       Results for orders placed or performed in visit on 10/10/23  Lipid panel   Collection Time: 10/10/23  7:52 AM  Result Value Ref Range   Cholesterol 234 (H) 0 - 200 mg/dL   Triglycerides 16.1 0.0 - 149.0 mg/dL   HDL 09.60 >45.40 mg/dL   VLDL 98.1 0.0 - 19.1 mg/dL   LDL Cholesterol 478 (H) 0 - 99 mg/dL   Total CHOL/HDL Ratio 3    NonHDL 164.58   Comprehensive metabolic panel   Collection Time: 10/10/23  7:52 AM  Result Value Ref Range   Sodium 139 135 - 145 mEq/L   Potassium 4.3 3.5 - 5.1 mEq/L   Chloride 104 96 - 112 mEq/L   CO2 30 19 - 32 mEq/L   Glucose, Bld 93 70 - 99 mg/dL   BUN 15 6 - 23 mg/dL   Creatinine, Ser 2.95 0.40 - 1.20 mg/dL   Total Bilirubin 1.0 0.2 - 1.2 mg/dL   Alkaline Phosphatase 46 39 - 117 U/L   AST 22 0 - 37 U/L   ALT 24 0 - 35 U/L   Total Protein 7.0 6.0 - 8.3 g/dL   Albumin 4.3 3.5 - 5.2 g/dL   GFR 62.13  >08.65 mL/min   Calcium 9.7 8.4 - 10.5 mg/dL    This visit occurred during the SARS-CoV-2 public health emergency.  Safety protocols were in place, including screening questions prior to the visit, additional usage of staff PPE, and extensive cleaning of exam room while observing appropriate contact time as indicated for disinfecting solutions.   COVID 19 screen:  No recent travel or known exposure to COVID19 The patient denies respiratory symptoms of COVID 19 at this time. The importance of social distancing was discussed today.   Assessment and Plan The patient's preventative maintenance and recommended screening tests for an annual wellness exam were reviewed in full today. Brought up to date unless services declined.  Counselled on the importance of diet, exercise, and its role in overall health and mortality. The patient's FH and SH was reviewed, including their home life, tobacco status, and drug and alcohol status.   Vaccines: Uptodate with td, PNA,COVID19  X 3, flu 05/2023, shingrix Pap/DVE:  09/2019, no further indicated, but will continue DVE exams every 1-2 years per pt request. Mammo:  11/2022, scheduled Bone Density: 03/2020, worsening control at last check. Back on fosamax since 04/2020  No jaw pain. Repeated on 09/2022  T-2.6 improved,  will plan D/C 04/2024, repeat DEXA 2026 Colon:  2007 normal, plan ifob yearly Smoking Status: none ETOH/ drug use: none/none  Hep C:   done, negative  HIV screen:   refused.   Problem List Items Addressed This Visit     Conductive hearing loss, bilateral    New, noted in last year worse in left over last 4 months.  She feels like in a barrel.  Will try trial of flonase 2 sprays per nostril daily . If not improving as expected will plan ENT referral.       High cholesterol   Osteoporosis   Chronic, improved back on fosamax.  Has been on Fosamax since 2021.... will plan D/C 04/2024, repeat DEXA 2026      Relevant Orders   DG Bone  Density   Other Visit Diagnoses       Medicare annual wellness visit, subsequent    -  Primary     Colon cancer screening       Relevant Orders   Fecal occult blood, imunochemical        Kerby Nora, MD

## 2023-10-16 NOTE — Assessment & Plan Note (Signed)
 New, noted in last year worse in left over last 4 months.  She feels like in a barrel.  Will try trial of flonase 2 sprays per nostril daily . If not improving as expected will plan ENT referral.

## 2023-10-16 NOTE — Patient Instructions (Addendum)
 Stop Fosamax 04/2024.  Pick up stool cards in labs on way out.

## 2023-10-16 NOTE — Assessment & Plan Note (Signed)
 Chronic, improved back on fosamax.  Has been on Fosamax since 2021.... will plan D/C 04/2024, repeat DEXA 2026

## 2023-10-19 ENCOUNTER — Other Ambulatory Visit: Payer: Self-pay | Admitting: Family Medicine

## 2023-11-21 ENCOUNTER — Telehealth: Payer: Self-pay

## 2023-11-21 DIAGNOSIS — Z139 Encounter for screening, unspecified: Secondary | ICD-10-CM

## 2023-11-21 NOTE — Telephone Encounter (Signed)
 Copied from CRM [id number]. Topic: General - Other >> Nov 21, 2023 12:03 PM Chuck Crater wrote: Reason for CRM: Patient wants to know what her blood type is. She is requesting a callback. Please leave a message if she can't be reached.

## 2023-11-21 NOTE — Telephone Encounter (Signed)
 LMTCB; do not see blood type in labs and we do not typically draw this unless patient is having surgery or a baby.

## 2023-11-22 ENCOUNTER — Ambulatory Visit: Payer: No Typology Code available for payment source

## 2023-11-22 NOTE — Telephone Encounter (Signed)
 She would just need to do a non-fasting lab. It is not too expensive. Will forward to Dr Cherlyn Cornet to put in orders. After that, the message can go to the front to get her scheduled for the lab.

## 2023-11-22 NOTE — Telephone Encounter (Signed)
 Terri, what is the correct order for this?  All I see is things in epic that go to the blood bank?

## 2023-11-22 NOTE — Telephone Encounter (Signed)
 Patient returned call, she would like to have blood typing done.  Please advise if she needs to have blood work done for that.

## 2023-11-26 NOTE — Addendum Note (Signed)
 Addended by: Herby Lolling E on: 11/26/2023 01:17 PM   Modules accepted: Orders

## 2023-11-26 NOTE — Telephone Encounter (Signed)
 Left message for Christy Gallegos that Dr. Cherlyn Cornet has placed order for blood type. I did advise insurance will not cover this test and she will have to pay out of pocket for it.  I also let her know she could also donate blood with [facility] and they will type her blood for free.  I ask that if she wants to move forward and have this test drawn at our office, she can just call the office to schedule a lab only appointment.

## 2023-12-12 ENCOUNTER — Ambulatory Visit
Admission: RE | Admit: 2023-12-12 | Discharge: 2023-12-12 | Disposition: A | Discharge status: Home / Self Care | Payer: No Typology Code available for payment source | Source: Ambulatory Visit | Attending: Family Medicine | Admitting: Family Medicine

## 2023-12-12 DIAGNOSIS — Z1231 Encounter for screening mammogram for malignant neoplasm of breast: Secondary | ICD-10-CM | POA: Diagnosis not present

## 2024-03-24 ENCOUNTER — Other Ambulatory Visit: Payer: Self-pay | Admitting: Family Medicine

## 2024-03-24 DIAGNOSIS — Z1231 Encounter for screening mammogram for malignant neoplasm of breast: Secondary | ICD-10-CM

## 2024-03-25 ENCOUNTER — Encounter: Payer: Self-pay | Admitting: Family Medicine

## 2024-03-25 ENCOUNTER — Telehealth: Payer: Self-pay | Admitting: Family Medicine

## 2024-03-25 ENCOUNTER — Other Ambulatory Visit: Payer: Self-pay | Admitting: Family Medicine

## 2024-03-25 DIAGNOSIS — M810 Age-related osteoporosis without current pathological fracture: Secondary | ICD-10-CM

## 2024-03-25 NOTE — Telephone Encounter (Signed)
 Orders in Dr. Avelina In box under [facility] Orders.   Please sign.

## 2024-03-25 NOTE — Telephone Encounter (Signed)
 Copied from CRM #[id number]. Topic: General - Other >> Mar 25, 2024 11:11 AM Franky GRADE wrote: Reason for CRM: Patient is call because [facility] need the order for the bone density to be place for their location.

## 2024-05-28 DIAGNOSIS — D225 Melanocytic nevi of trunk: Secondary | ICD-10-CM | POA: Diagnosis not present

## 2024-05-28 DIAGNOSIS — L814 Other melanin hyperpigmentation: Secondary | ICD-10-CM | POA: Diagnosis not present

## 2024-05-28 DIAGNOSIS — Z85828 Personal history of other malignant neoplasm of skin: Secondary | ICD-10-CM | POA: Diagnosis not present

## 2024-05-28 DIAGNOSIS — L821 Other seborrheic keratosis: Secondary | ICD-10-CM | POA: Diagnosis not present

## 2024-05-28 DIAGNOSIS — L570 Actinic keratosis: Secondary | ICD-10-CM | POA: Diagnosis not present

## 2024-09-15 ENCOUNTER — Other Ambulatory Visit

## 2024-10-10 ENCOUNTER — Other Ambulatory Visit

## 2024-10-17 ENCOUNTER — Encounter: Admitting: Family Medicine

## 2024-12-15 ENCOUNTER — Ambulatory Visit
